# Patient Record
Sex: Male | Born: 1990 | Race: Black or African American | Hispanic: No | Marital: Single | Smoking: Never smoker
Health system: Southern US, Community
[De-identification: ages and names within clinical notes are randomized; demographics above are authoritative.]

## PROBLEM LIST (undated history)

## (undated) DIAGNOSIS — R55 Syncope and collapse: Secondary | ICD-10-CM

## (undated) DIAGNOSIS — N4830 Priapism, unspecified: Principal | ICD-10-CM

## (undated) DIAGNOSIS — R9431 Abnormal electrocardiogram [ECG] [EKG]: Secondary | ICD-10-CM

---

## 2011-06-05 ENCOUNTER — Emergency Department (HOSPITAL_COMMUNITY)
Admission: EM | Admit: 2011-06-05 | Discharge: 2011-06-05 | Disposition: A | Discharge status: Home / Self Care | Payer: PRIVATE HEALTH INSURANCE | Attending: Emergency Medicine | Admitting: Emergency Medicine

## 2011-06-05 DIAGNOSIS — Y92009 Unspecified place in unspecified non-institutional (private) residence as the place of occurrence of the external cause: Secondary | ICD-10-CM | POA: Insufficient documentation

## 2011-06-05 DIAGNOSIS — S61209A Unspecified open wound of unspecified finger without damage to nail, initial encounter: Secondary | ICD-10-CM | POA: Insufficient documentation

## 2011-06-05 DIAGNOSIS — W260XXA Contact with knife, initial encounter: Secondary | ICD-10-CM | POA: Insufficient documentation

## 2011-06-05 DIAGNOSIS — S6980XA Other specified injuries of unspecified wrist, hand and finger(s), initial encounter: Secondary | ICD-10-CM | POA: Insufficient documentation

## 2011-06-05 DIAGNOSIS — S6990XA Unspecified injury of unspecified wrist, hand and finger(s), initial encounter: Secondary | ICD-10-CM | POA: Insufficient documentation

## 2011-06-14 ENCOUNTER — Inpatient Hospital Stay (INDEPENDENT_AMBULATORY_CARE_PROVIDER_SITE_OTHER)
Admission: RE | Admit: 2011-06-14 | Discharge: 2011-06-14 | Disposition: A | Discharge status: Home / Self Care | Payer: PRIVATE HEALTH INSURANCE | Source: Ambulatory Visit | Attending: Emergency Medicine | Admitting: Emergency Medicine

## 2011-06-14 DIAGNOSIS — Z4802 Encounter for removal of sutures: Secondary | ICD-10-CM

## 2011-11-15 ENCOUNTER — Observation Stay (HOSPITAL_COMMUNITY)
Admission: EM | Admit: 2011-11-15 | Discharge: 2011-11-17 | Disposition: A | Discharge status: Home / Self Care | Payer: PRIVATE HEALTH INSURANCE | Source: Ambulatory Visit | Attending: Internal Medicine | Admitting: Internal Medicine

## 2011-11-15 ENCOUNTER — Other Ambulatory Visit: Payer: Self-pay

## 2011-11-15 ENCOUNTER — Encounter (HOSPITAL_COMMUNITY): Payer: Self-pay | Admitting: Neurology

## 2011-11-15 ENCOUNTER — Emergency Department (INDEPENDENT_AMBULATORY_CARE_PROVIDER_SITE_OTHER)
Admission: EM | Admit: 2011-11-15 | Discharge: 2011-11-15 | Disposition: A | Discharge status: Nursing Home (Includes ICF) | Payer: PRIVATE HEALTH INSURANCE | Source: Home / Self Care

## 2011-11-15 ENCOUNTER — Emergency Department (HOSPITAL_COMMUNITY): Payer: PRIVATE HEALTH INSURANCE

## 2011-11-15 DIAGNOSIS — N4830 Priapism, unspecified: Principal | ICD-10-CM

## 2011-11-15 DIAGNOSIS — R9431 Abnormal electrocardiogram [ECG] [EKG]: Secondary | ICD-10-CM

## 2011-11-15 DIAGNOSIS — I517 Cardiomegaly: Secondary | ICD-10-CM | POA: Insufficient documentation

## 2011-11-15 DIAGNOSIS — R55 Syncope and collapse: Secondary | ICD-10-CM | POA: Insufficient documentation

## 2011-11-15 HISTORY — DX: Abnormal electrocardiogram (ECG) (EKG): R94.31

## 2011-11-15 HISTORY — DX: Priapism, unspecified: N48.30

## 2011-11-15 HISTORY — DX: Syncope and collapse: R55

## 2011-11-15 LAB — POCT I-STAT, CHEM 8
Calcium, Ion: 1.17 mmol/L (ref 1.12–1.32)
Creatinine, Ser: 0.9 mg/dL (ref 0.50–1.35)
Glucose, Bld: 93 mg/dL (ref 70–99)
HCT: 47 % (ref 39.0–52.0)
Hemoglobin: 16 g/dL (ref 13.0–17.0)
Potassium: 4.1 mEq/L (ref 3.5–5.1)
TCO2: 27 mmol/L (ref 0–100)

## 2011-11-15 LAB — RAPID URINE DRUG SCREEN, HOSP PERFORMED
Amphetamines: NOT DETECTED
Barbiturates: NOT DETECTED
Opiates: NOT DETECTED
Tetrahydrocannabinol: NOT DETECTED

## 2011-11-15 MED ORDER — ACETAMINOPHEN 325 MG PO TABS: 650.0000 mg | ORAL_TABLET | Freq: Four times a day (QID) | ORAL | Status: DC | PRN

## 2011-11-15 MED ORDER — ONDANSETRON HCL 4 MG/2ML IJ SOLN: 4.0000 mg | Freq: Four times a day (QID) | INTRAMUSCULAR | Status: DC | PRN

## 2011-11-15 MED ORDER — ONDANSETRON HCL 4 MG PO TABS: 4.0000 mg | ORAL_TABLET | Freq: Four times a day (QID) | ORAL | Status: DC | PRN

## 2011-11-15 MED ORDER — SODIUM CHLORIDE 0.9 % IV SOLN: INTRAVENOUS | Status: DC

## 2011-11-15 MED ORDER — TERBUTALINE SULFATE 1 MG/ML IJ SOLN
0.2500 mg | Freq: Once | INTRAMUSCULAR | Status: AC
  Administered 2011-11-15: 0.25 mg via SUBCUTANEOUS
  Filled 2011-11-15: qty 1

## 2011-11-15 MED ORDER — PHENYLEPHRINE HCL 10 MG/ML IJ SOLN
0.1000 mg | Freq: Once | INTRAMUSCULAR | Status: AC | PRN
  Administered 2011-11-15: 0.1 mg via SUBCUTANEOUS
  Filled 2011-11-15: qty 0.01

## 2011-11-15 MED ORDER — ACETAMINOPHEN 650 MG RE SUPP: 650.0000 mg | Freq: Four times a day (QID) | RECTAL | Status: DC | PRN

## 2011-11-15 MED ORDER — PSEUDOEPHEDRINE HCL 60 MG PO TABS
60.0000 mg | ORAL_TABLET | ORAL | Status: AC
  Administered 2011-11-15: 60 mg via ORAL
  Filled 2011-11-15: qty 1

## 2011-11-15 MED ORDER — SODIUM CHLORIDE 0.9 % IV SOLN
INTRAVENOUS | Status: DC
  Administered 2011-11-16 – 2011-11-17 (×3): via INTRAVENOUS

## 2011-11-15 MED ORDER — SODIUM CHLORIDE 0.9 % IV SOLN
INTRAVENOUS | Status: DC
  Administered 2011-11-15 – 2011-11-17 (×4): via INTRAVENOUS

## 2011-11-15 NOTE — ED Notes (Signed)
Initially pt requesting to leave AMA. 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 EDP in to speak with patient about importance of staying for observation. Informed of severity of possible condition. Chaplain involved to speak with patient about possible severity of clinical condition. RN in to speak with patient. Informed of severity of EKG presentation. Pt encouraged to stay for observation. Pt requesting to call family member. After speaking with medical team and family member patient agreeing to stay for observation. Dinner tray ordered for patient. Attempt to make patient comfortable.

## 2011-11-15 NOTE — ED Provider Notes (Signed)
Medical screening examination/treatment/procedure(s) were performed by non-physician practitioner and as supervising physician I was immediately available for consultation/collaboration.  Raynald Blend, MD 11/15/11 1214

## 2011-11-15 NOTE — ED Notes (Signed)
1 day hx of urinary difficulty.  Denies any discharge.  In addition, pt. passed out in lobby before being seen.  Now pt. states his head hurts.  Denies any other pain sites.  Brought pt. Back to room to be seen after passing out.  Pt. States he started feeling dizzy and passed out.  States he hit his head when he fell.  Pt. Was able to answer orientation questions right away and was able to follow commands.  Moves all extremities well.

## 2011-11-15 NOTE — ED Notes (Signed)
Pt asked to provide urine sample. Reports unable to at this time. Urinal at bedside

## 2011-11-15 NOTE — ED Provider Notes (Signed)
History     CSN: [id number] Arrival date & time: 11/15/2011 11:16 AM   None     Chief Complaint  Patient presents with  . Urinary Retention    1 day hx of urinary difficulty.  Denies any discharge.  In addition, pt. passed out in lobby before being seen.  Now pt. states his head hurts.  Denies any other pain sites.      (Consider location/radiation/quality/duration/timing/severity/associated sxs/prior treatment) HPI Comments: "My penis won't go down."  States he has had an erection since he awoke at 6 am and won't go down. He denies taking any medications or using any over the counter supplements. Then he was registering at the front desk and began to feel lightheaded then blacked out. No chest discomfort or palpitations. Has been able to void a small amount this morning. Has a mildly tender area Rt posterior head from hitting his head when he passed out. Denies HA, or current dizziness. No N/V, visual changes or parasthesias.  The history is provided by the patient.    History reviewed. No pertinent past medical history.  History reviewed. No pertinent past surgical history.  History reviewed. No pertinent family history.  History  Substance Use Topics  . Smoking status: Never Smoker   . Smokeless tobacco: Never Used  . Alcohol Use: Yes     occational      Review of Systems  Gastrointestinal: Negative for nausea, vomiting and abdominal pain.  Genitourinary: Positive for decreased urine volume, penile swelling and penile pain. Negative for discharge, scrotal swelling, genital sores and testicular pain.  Neurological: Positive for syncope and light-headedness. Negative for weakness, numbness and headaches.    Allergies  Review of patient's allergies indicates no known allergies.  Home Medications  No current outpatient prescriptions on file.  BP 146/80  Pulse 63  Temp(Src) 98.5 F (36.9 C) (Oral)  Resp 16  SpO2 100%  Physical Exam  Nursing note and vitals  reviewed. Constitutional: He appears well-developed and well-nourished. No distress.  HENT:  Head: Normocephalic and atraumatic.  Right Ear: Tympanic membrane, external ear and ear canal normal.  Left Ear: Tympanic membrane, external ear and ear canal normal.  Nose: Nose normal.  Mouth/Throat: Uvula is midline, oropharynx is clear and moist and mucous membranes are normal. No oropharyngeal exudate, posterior oropharyngeal edema or posterior oropharyngeal erythema.  Eyes: Conjunctivae, EOM and lids are normal. Pupils are equal, round, and reactive to light.  Neck: Neck supple.  Cardiovascular: Normal rate, regular rhythm and normal heart sounds.   Pulmonary/Chest: Effort normal and breath sounds normal. No respiratory distress.  Genitourinary: Testes normal.    No discharge found.  Musculoskeletal:       Cervical back: Normal.  Lymphadenopathy:    He has no cervical adenopathy.       Right: No inguinal adenopathy present.       Left: No inguinal adenopathy present.  Neurological: He is alert.  Skin: Skin is warm and dry.  Psychiatric: He has a normal mood and affect.    ED Course  Procedures (including critical care time)  Labs Reviewed - No data to display No results found.   1. Priapism   2. Syncope       MDM  Pt transferred to [facility] via [facility] with Priapism x > 5 hrs and syncopal episode.         Melody Comas, [place] 11/15/11 1154

## 2011-11-15 NOTE — ED Provider Notes (Signed)
History     CSN: [id number] Arrival date & time: 11/15/2011 12:14 PM  Chief Complaint  Patient presents with  . Personal Problem    priprism   . Loss of Consciousness    syncope while at [facility]    HPI Pt was seen at 1250.  Per pt, c/o gradual onset and persistence of constant priapism that began this morning PTA.  Pt states he woke up approx 0700 with the priapism.  States he took a cold shower without change in condition.  Pt went to [facility] for eval, while he was there be became "lightheaded" followed by a brief syncopal episode.  Pt was then transferred to the ED for further eval.  Denies hx of sickle cell, denies illicit or herbal drug use, denies taking erectile dysfunction medicines.  No reported seizure activity, no CP/palpitations, no SOB, no abd pain, no back pain.    History reviewed. No pertinent past medical history.  History reviewed. No pertinent past surgical history.   History  Substance Use Topics  . Smoking status: Never Smoker   . Smokeless tobacco: Never Used  . Alcohol Use: Yes     occational     Review of Systems ROS: Statement: All systems negative except as marked or noted in the HPI; Constitutional: Negative for fever and chills. ; ; Eyes: Negative for eye pain, redness and discharge. ; ; ENMT: Negative for ear pain, hoarseness, nasal congestion, sinus pressure and sore throat. ; ; Cardiovascular: Negative for chest pain, palpitations, diaphoresis, dyspnea and peripheral edema. ; ; Respiratory: Negative for cough, wheezing and stridor. ; ; Gastrointestinal: Negative for nausea, vomiting, diarrhea, abdominal pain, blood in stool, hematemesis, jaundice and rectal bleeding. ; Genitourinary: Negative for dysuria, flank pain and hematuria. Genital:  No penile drainage or rash, no testicular pain or swelling, no scrotal rash or swelling.  +priapism.; Musculoskeletal: Negative for back pain and neck pain. Negative for swelling and trauma.; ; Skin: Negative for pruritus,  rash, abrasions, blisters, bruising and skin lesion.; ; Neuro: Negative for headache, and neck stiffness. Negative for weakness, altered level of consciousness , altered mental status, extremity weakness, paresthesias, involuntary movement, seizure and +lightheadedness, syncope.     Allergies  Review of patient's allergies indicates no known allergies.  Home Medications  No current outpatient prescriptions on file.  BP 148/80  Pulse 62  Temp(Src) 98 F (36.7 C) (Oral)  Resp 18  SpO2 100%  Physical Exam 1255: Physical examination:  Nursing notes reviewed; Vital signs and O2 SAT reviewed;  Constitutional: Well developed, Well nourished, Well hydrated, In no acute distress; Head:  Normocephalic, atraumatic; Eyes: EOMI, PERRL, No scleral icterus; ENMT: Mouth and pharynx normal, Mucous membranes moist; Neck: Supple, Full range of motion, No lymphadenopathy; Cardiovascular: Regular rate and rhythm, No murmur, rub, or gallop; Respiratory: Breath sounds clear & equal bilaterally, No rales, rhonchi, wheezes, or rub, Normal respiratory effort/excursion; Chest: Nontender, Movement normal; Abdomen: Soft, Nontender, Nondistended, Normal bowel sounds; GU:  Genital exam performed with pt permission and male ED Tech chaparone present during exam. No perineal erythema.  +priapism, no penile lesions or drainage.  No scrotal erythema, edema or tenderness to palp.  Normal testicular lie.  No testicular tenderness to palp.  +cremasteric reflexes bilat.  No inguinal LAN or palpable masses. Extremities: Pulses normal, No tenderness, No edema, No calf edema or asymmetry.; Neuro: AA&Ox3, Major CN grossly intact. Speech clear, no facial droop.  Normal coordination.  No gross focal motor or sensory deficits in extremities.;  Skin: Color normal, Warm, Dry, no rash.    ED Course  Inject corpus cavernosum, priapism Date/Time: 11/15/2011 3:30 PM Performed by: Laray Anger Authorized by: Laray Anger Consent:  Verbal consent obtained. Risks and benefits: risks, benefits and alternatives were discussed Consent given by: patient Patient understanding: patient states understanding of the procedure being performed Patient consent: the patient's understanding of the procedure matches consent given Procedure consent: procedure consent matches procedure scheduled Relevant documents: relevant documents present and verified Test results available and properly labeled: N/A. Site marked: the operative site was marked Imaging studies available: N/A. Required items: required blood products, implants, devices, and special equipment available Patient identity confirmed: verbally with patient, arm band, hospital-assigned identification number and provided demographic data Time out: Immediately prior to procedure a "time out" was called to verify the correct patient, procedure, equipment, support staff and site/side marked as required. Preparation: Patient was prepped and draped in the usual sterile fashion. Local anesthesia used: no Patient sedated: no Patient tolerance: Patient tolerated the procedure well with no immediate complications.      MDM  MDM Reviewed: nursing note, vitals and previous chart Interpretation: ECG, labs, x-ray and CT scan Consults: cardiology    Date: 11/15/2011  Rate: 57  Rhythm: normal sinus rhythm  QRS Axis: normal  Intervals: normal  ST/T Wave abnormalities: nonspecific T wave changes, RSR V1/V2, +biphasic T-waves leads V2-V4  Conduction Disutrbances:none  Narrative Interpretation:   Old EKG Reviewed: none available.  Results for orders placed during the hospital encounter of 11/15/11  POCT I-STAT, CHEM 8      Component Value Range   Sodium 140  135 - 145 (mEq/L)   Potassium 4.1  3.5 - 5.1 (mEq/L)   Chloride 104  96 - 112 (mEq/L)   BUN 5 (*) 6 - 23 (mg/dL)   Creatinine, Ser 1.61  0.50 - 1.35 (mg/dL)   Glucose, Bld 93  70 - 99 (mg/dL)   Calcium, Ion 0.96  0.45 -  1.32 (mmol/L)   TCO2 27  0 - 100 (mmol/L)   Hemoglobin 16.0  13.0 - 17.0 (g/dL)   HCT 40.9  81.1 - 91.4 (%)  POCT I-STAT TROPONIN I      Component Value Range   Troponin i, poc 0.01  0.00 - 0.08 (ng/mL)   Comment 3            Ct Head Wo Contrast 11/15/2011  *RADIOLOGY REPORT*  Clinical Data: Loss of consciousness.  Hit back of head.  Denies headache.  CT HEAD WITHOUT CONTRAST  Technique:  Contiguous axial images were obtained from the base of the skull through the vertex without contrast.  Comparison: None.  Findings: No acute cortical infarct, hemorrhage, mass lesion is present.  The ventricles are of normal size.  No significant extra- axial fluid collection is present.  Right parietal and occipital scalp soft tissue swelling is present. There is no underlying fracture.  A fluid level in the left maxillary sinus is of intermediate density.  Mucosal thickening is present in the right maxillary sinus.  There is opacification along the inferior aspect of the right frontal sinus.  The mastoid air cells are clear.  IMPRESSION:  1.  Normal CT appearance the brain. 2.  Right occipital parietal scalp soft tissue swelling without underlying fracture. 3.  Left greater than right maxillary sinus disease.  Original Report Authenticated By: Jamesetta Orleans. MATTERN, M.D.   Dg Chest Port 1 View 11/15/2011  *RADIOLOGY REPORT*  Clinical Data: Syncopal  episode.  No chest complaints  PORTABLE CHEST - 1 VIEW  Comparison: None.  Findings: Normal heart size with clear lung fields.  No bony abnormality.  IMPRESSION: Negative.  Original Report Authenticated By: Elsie Stain, M.D.     1330:  T/C to [facility] Dr. Annabell Howells, case discussed, including:  HPI, pertinent PM/SHx, VS/PE, dx testing, ED course and treatment.  Requests to try phenylephrine intracavernosal injection, if not successful call him back.   1500:  Cards MD Stucky in ED, he reviewed pt's EKG with myself.  Agrees with my concern of biphasic T-waves leads V2-4 (may  be indicative of an LAD lesion) and needs obs admit; ok for medicine svc to admit to tele.  Long d/w pt re: abnl EKG, and my recommendation for admit/observation.  Pt stated he would "think about it."  1530:  Sudafed PO and terbutaline SQ given, priapism unchanged.  Procedure (as above):  intracavernosal injection of 2ml of 147mcg/ml phenylepherine soln injected at 2 o'clock area at base of penis after prepped in usual sterile fashion and verbal consent obtained by pt to perform procedure. Tolerated well without adverse reaction.  1645:  Complete and sustained detumescence.  Pt wants to go home.  Once again: myself, ED RN and Hospital Chaplain all spoke with pt re: abnl EKG that may be a sign that he is high risk for heart disease/MI with a potential for substantial mortality (death).  Pt verb understanding and states again he wants to go home.  Pt again informed re: dx testing results, including EKG changes concerning for cardiac disease and that I recommend admission for further evaluation.  Pt again refuses admission.  I encouraged pt to stay, continues to refuse.  Pt makes his own medical decisions.  Risks of AMA explained to pt by myself, the ED RN and the hospital Chaplain, including, but not limited to:  stroke, heart attack, cardiac arrythmia ("irregular heart rate/beat"), "passing out," temporary and/or permanent disability, and very high risk of death.  Pt verb understanding and continues to refuse admission, understanding the consequences of his decision.  I encouraged pt to follow up with his PMD and the Cardiologist tomorrow and return to the ED immediately if symptoms return, or for any other concerns.  Pt verb understanding, agreeable.      1725:  Pt's ED RN spoke with pt once again.  Pt called his grandfather.  Pt put me on the phone with his grandfather after giving me permission to talk with him. Long d/w pt's grandfather regarding my concerns of pt's abnl EKG.  Grandfather states to admit pt  to the hospital and he will further talk with pt.  Pt now agreeable to admit.  5:42 PM:  T/C to [facility] Dr. 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  Agreeable to admit.  She will come to ED for eval.       [facility]   Laray Anger, DO 11/17/11 1101

## 2011-11-15 NOTE — ED Notes (Signed)
Pt taken to radiology

## 2011-11-15 NOTE — Progress Notes (Signed)
Dr. Asked that I accompany her as a male observer while she physically examined pt.( male). Pt. refused dr.'s advise and indicated that he was leaving AMA.  I spoke to him to ensure that this was what he really wanted to do. After speaking with both of [place]  pt. indicated that he was still going to leave. Dr. expressed her concerns regarding his welfare.    11/15/11 1600  Clinical Encounter Type  Visited With Patient  Visit Type Other (Comment) (Assisted  Physican with Pt.)  Spiritual Encounters  Spiritual Needs (provide counsel and encouragement to pt. Marland Kitchen)

## 2011-11-15 NOTE — ED Notes (Addendum)
Pt genital pain 8/10. Reports urinating a small amount this morning. Denying any CP or SOB. Vitals stable. Pt on monitor

## 2011-11-15 NOTE — ED Notes (Signed)
Pt order dinner tray, given snacks, water per Dr. Clarene Duke

## 2011-11-15 NOTE — Progress Notes (Signed)
11/15/2011 patient came from the emergency room to 6700, he's alert and oriented. Patient a little weak. He was placed on telemetry and doing sinus rhythm. Patient had no complaints of any pain or discomfort. Skin is fine, noted little dark area on the back and two little pimples on the right side of neck. Patient is a fall risk have a yellow arm band and red socks on.

## 2011-11-15 NOTE — ED Notes (Signed)
Dr. Lynford Citizen paged for temporary orders. MD reporting send pt upstairs will see upstairs

## 2011-11-15 NOTE — ED Notes (Signed)
EDP at bedside  

## 2011-11-15 NOTE — ED Notes (Signed)
PER EMS- Pt reports woke up this morning at 7 am with Priprism. Denies taking any viagra or other medications. Pt reports attempting to take cold shower to relieve problem, all attempts were unsuccessful. While at Urgent care, pt had syncopal episode. IV started while at [facility]. At this time patient alert and oriented. Pain 8/10. Pt responding appropriately.

## 2011-11-15 NOTE — ED Notes (Signed)
FLOOR RN informed pt will be seen by admitting MD on the floor;

## 2011-11-15 NOTE — H&P (Addendum)
Joe Williamson is an 20 y.o. male.   PCP - None. Chief Complaint: Loss of consciousness. HPI: 20 year old male with no significant history had a persistent erection of his penis and he came to the ER. While waiting in the ER patient had a brief episode of loss of consciousness. He does not recall how long he lost his consciousness but he did hit his head. He denies any focal deficit, any headache, palpitations, shortness of breath, diaphoresis, nausea vomiting, chest pain. He did have a brief episode blurred vision before he fell. Patient had CAT scan of the head which did not show any acute. His EKG was showing Wellen's sign as per the ER physician. Patient's cardiac enzymes have been negative patient has been admitted for further observation. For his priapism ER physician Dr. Clarene Duke had contacted urologist on call Dr. Wilson Singer who advised to inject phenylephrine. After which his penis detumescent. He states that he had sexual contact late in the night and since then his erection was constant. Denies any trauma and denies any family she of sickle cell disease or personal history of sickle cell disease. Denies abusing any illegal drugs.  History reviewed. No pertinent past medical history.  History reviewed. No pertinent past surgical history.  Family History  Problem Relation Age of Onset  . Diabetes type II Mother    Social History:  reports that he has never smoked. He has never used smokeless tobacco. He reports that he drinks alcohol. He reports that he does not use illicit drugs.  Allergies: No Known Allergies  Medications Prior to Admission  Medication Dose Route Frequency Provider Last Rate Last Dose  . 0.9 %  sodium chloride infusion   Intravenous Continuous Laray Anger, DO 125 mL/hr at 11/15/11 1354    . 0.9 %  sodium chloride infusion   Intravenous Continuous Eduard Clos      . acetaminophen (TYLENOL) tablet 650 mg  650 mg Oral Q6H PRN Eduard Clos       Or  . acetaminophen (TYLENOL) suppository 650 mg  650 mg Rectal Q6H PRN Eduard Clos      . ondansetron (ZOFRAN) tablet 4 mg  4 mg Oral Q6H PRN Eduard Clos       Or  . ondansetron (ZOFRAN) injection 4 mg  4 mg Intravenous Q6H PRN Eduard Clos      . phenylephrine (NEO-SYNEPHRINE) injection 0.1 mg  0.1 mg Subcutaneous Once PRN Laray Anger, DO   0.1 mg at 11/15/11 1548  . pseudoephedrine (SUDAFED) tablet 60 mg  60 mg Oral To Major Laray Anger, DO   60 mg at 11/15/11 1412  . terbutaline (BRETHINE) injection 0.25 mg  0.25 mg Subcutaneous Once Laray Anger, DO   0.25 mg at 11/15/11 1446  . DISCONTD: 0.9 %  sodium chloride infusion   Intravenous Continuous Melody Comas, [place]       No current outpatient prescriptions on file as of 11/15/2011.    Results for orders placed during the hospital encounter of 11/15/11 (from the past 48 hour(s))  POCT I-STAT TROPONIN I     Status: Normal   Collection Time   11/15/11  1:21 PM      Component Value Range Comment   Troponin i, poc 0.01  0.00 - 0.08 (ng/mL)    Comment 3            POCT I-STAT, CHEM 8     Status: Abnormal  Collection Time   11/15/11  1:23 PM      Component Value Range Comment   Sodium 140  135 - 145 (mEq/L)    Potassium 4.1  3.5 - 5.1 (mEq/L)    Chloride 104  96 - 112 (mEq/L)    BUN 5 (*) 6 - 23 (mg/dL)    Creatinine, Ser 1.61  0.50 - 1.35 (mg/dL)    Glucose, Bld 93  70 - 99 (mg/dL)    Calcium, Ion 0.96  1.12 - 1.32 (mmol/L)    TCO2 27  0 - 100 (mmol/L)    Hemoglobin 16.0  13.0 - 17.0 (g/dL)    HCT 04.5  40.9 - 81.1 (%)   URINE RAPID DRUG SCREEN (HOSP PERFORMED)     Status: Normal   Collection Time   11/15/11  6:01 PM      Component Value Range Comment   Opiates NONE DETECTED  NONE DETECTED     Cocaine NONE DETECTED  NONE DETECTED     Benzodiazepines NONE DETECTED  NONE DETECTED     Amphetamines NONE DETECTED  NONE DETECTED     Tetrahydrocannabinol NONE DETECTED  NONE DETECTED      Barbiturates NONE DETECTED  NONE DETECTED     Ct Head Wo Contrast  11/15/2011  *RADIOLOGY REPORT*  Clinical Data: Loss of consciousness.  Hit back of head.  Denies headache.  CT HEAD WITHOUT CONTRAST  Technique:  Contiguous axial images were obtained from the base of the skull through the vertex without contrast.  Comparison: None.  Findings: No acute cortical infarct, hemorrhage, mass lesion is present.  The ventricles are of normal size.  No significant extra- axial fluid collection is present.  Right parietal and occipital scalp soft tissue swelling is present. There is no underlying fracture.  A fluid level in the left maxillary sinus is of intermediate density.  Mucosal thickening is present in the right maxillary sinus.  There is opacification along the inferior aspect of the right frontal sinus.  The mastoid air cells are clear.  IMPRESSION:  1.  Normal CT appearance the brain. 2.  Right occipital parietal scalp soft tissue swelling without underlying fracture. 3.  Left greater than right maxillary sinus disease.  Original Report Authenticated By: Joe Williamson, M.D.   Dg Chest Port 1 View  11/15/2011  *RADIOLOGY REPORT*  Clinical Data: Syncopal episode.  No chest complaints  PORTABLE CHEST - 1 VIEW  Comparison: None.  Findings: Normal heart size with clear lung fields.  No bony abnormality.  IMPRESSION: Negative.  Original Report Authenticated By: Elsie Stain, M.D.    Review of Systems  Constitutional: Negative.   HENT: Negative.   Eyes: Negative.   Respiratory: Negative.   Cardiovascular: Negative.   Gastrointestinal: Negative.   Genitourinary:       Priapism  Musculoskeletal: Negative.   Skin: Negative.   Neurological: Positive for loss of consciousness.  Endo/Heme/Allergies: Negative.   Psychiatric/Behavioral: Negative.     Blood pressure 125/83, pulse 61, temperature 98.4 F (36.9 C), temperature source Oral, resp. rate 18, height 5\' 7"  (1.702 m), weight 75.8 kg  (167 lb 1.7 oz), SpO2 96.00%. Physical Exam  Constitutional: He is oriented to person, place, and time. He appears well-developed and well-nourished. No distress.  HENT:  Head: Normocephalic and atraumatic.  Right Ear: External ear normal.  Left Ear: External ear normal.  Nose: Nose normal.  Mouth/Throat: Oropharynx is clear and moist. No oropharyngeal exudate.  Eyes: Conjunctivae are normal. Pupils  are equal, round, and reactive to light. Right eye exhibits no discharge. Left eye exhibits no discharge.  Neck: Normal range of motion. Neck supple. No JVD present.  Cardiovascular: Normal rate, regular rhythm and normal heart sounds.   Respiratory: Effort normal and breath sounds normal. No stridor. No respiratory distress. He has no wheezes. He has no rales.  GI: Soft. Bowel sounds are normal. He exhibits no distension. There is no tenderness.  Genitourinary:       Penis has detumescence. No discharge seen or not tender.  Musculoskeletal: Normal range of motion. He exhibits no edema and no tenderness.  Neurological: He is alert and oriented to person, place, and time. He has normal reflexes. No cranial nerve deficit. Coordination normal.  Skin: Skin is warm and dry. No rash noted. He is not diaphoretic.  Psychiatric: His behavior is normal.     Assessment/Plan #1. Syncope with abnormal EKG - will monitor in the telemetry. His EKG was showing Wellen's sign as per ER physician and I don't have a copy of the EKG so I have ordered another one. Get a  2-D echo. Presently patient has no chest pain, shortness of breath, focal deficit, palpitation, any headache. #2. Priapism - it has resolved after the phenylephrine injection. I did discuss with on-call urologist Dr Wilson Singer who advised as patient is asymptomatic with regard to his priapism advised to get a sickle cell screening and to arrange followup with him as outpatient.  CODE STATUS - full code.   Joe Berrocal N. 11/15/2011, 9:39  PM   Addendum - I reviewed patient's EKG, it does show some ST-T changes in V1 V2. Will consult cardiology.

## 2011-11-16 LAB — TSH: TSH: 2.336 u[IU]/mL (ref 0.350–4.500)

## 2011-11-16 LAB — CBC
HCT: 40.5 % (ref 39.0–52.0)
MCHC: 32.3 g/dL (ref 30.0–36.0)
MCV: 82.7 fL (ref 78.0–100.0)
Platelets: 194 10*3/uL (ref 150–400)
RDW: 14.3 % (ref 11.5–15.5)
WBC: 10.2 10*3/uL (ref 4.0–10.5)

## 2011-11-16 LAB — COMPREHENSIVE METABOLIC PANEL
ALT: 12 U/L (ref 0–53)
Albumin: 3.6 g/dL (ref 3.5–5.2)
Alkaline Phosphatase: 56 U/L (ref 39–117)
BUN: 7 mg/dL (ref 6–23)
Chloride: 105 mEq/L (ref 96–112)
GFR calc Af Amer: >90 mL/min (ref >90)
Glucose, Bld: 88 mg/dL (ref 70–99)
Potassium: 4.1 mEq/L (ref 3.5–5.1)
Sodium: 139 mEq/L (ref 135–145)
Total Bilirubin: 0.9 mg/dL (ref 0.3–1.2)
Total Protein: 7.1 g/dL (ref 6.0–8.3)

## 2011-11-16 LAB — CARDIAC PANEL(CRET KIN+CKTOT+MB+TROPI)
CK, MB: 2.3 ng/mL (ref 0.3–4.0)
CK, MB: 2.1 ng/mL (ref 0.3–4.0)
CK, MB: 1.9 ng/mL (ref 0.3–4.0)
Troponin I: <0.3 ng/mL (ref <0.30)
Troponin I: <0.3 ng/mL (ref <0.30)

## 2011-11-16 LAB — MAGNESIUM: Magnesium: 1.9 mg/dL (ref 1.5–2.5)

## 2011-11-16 NOTE — Progress Notes (Signed)
rn notified 

## 2011-11-16 NOTE — Progress Notes (Signed)
*  PRELIMINARY RESULTS* Echocardiogram 2D Echocardiogram has been performed.  Clide Deutscher RDCS 11/16/2011, 3:47 PM

## 2011-11-16 NOTE — Progress Notes (Signed)
Patient ID: Joe Williamson, male   DOB: January 21, 1991, 20 y.o.   MRN: [id number] Subjective: Patient seen.No penile erection.Denies any complaints  Objective: Weight change:   Intake/Output Summary (Last 24 hours) at 11/16/11 0843 Last data filed at 11/16/11 5621  Gross per 24 hour  Intake 2513.33 ml  Output    500 ml  Net 2013.33 ml   BP 131/74  Pulse 67  Temp(Src) 98.1 F (36.7 C) (Oral)  Resp 18  Ht 5\' 7"  (1.702 m)  Wt 75.8 kg (167 lb 1.7 oz)  BMI 26.17 kg/m2  SpO2 98% Physical Exam: General appearance: alert, cooperative and no distress Head: Normocephalic, without obvious abnormality, atraumatic Neck: no adenopathy, no carotid bruit, no JVD, supple, symmetrical, trachea midline and thyroid not enlarged, symmetric, no tenderness/mass/nodules Lungs: clear to auscultation bilaterally Heart: regular rate and rhythm, S1, S2 normal, no murmur, click, rub or gallop Abdomen: soft, non-tender; bowel sounds normal; no masses,  no organomegaly Extremities: extremities normal, atraumatic, no cyanosis or edema Skin: Skin color, texture, turgor normal. No rashes or lesions  Lab Results: Results for orders placed during the hospital encounter of 11/15/11 (from the past 48 hour(s))  POCT I-STAT TROPONIN I     Status: Normal   Collection Time   11/15/11  1:21 PM      Component Value Range Comment   Troponin i, poc 0.01  0.00 - 0.08 (ng/mL)    Comment 3            POCT I-STAT, CHEM 8     Status: Abnormal   Collection Time   11/15/11  1:23 PM      Component Value Range Comment   Sodium 140  135 - 145 (mEq/L)    Potassium 4.1  3.5 - 5.1 (mEq/L)    Chloride 104  96 - 112 (mEq/L)    BUN 5 (*) 6 - 23 (mg/dL)    Creatinine, Ser 3.08  0.50 - 1.35 (mg/dL)    Glucose, Bld 93  70 - 99 (mg/dL)    Calcium, Ion 6.57  1.12 - 1.32 (mmol/L)    TCO2 27  0 - 100 (mmol/L)    Hemoglobin 16.0  13.0 - 17.0 (g/dL)    HCT 84.6  96.2 - 95.2 (%)   URINE RAPID DRUG SCREEN (HOSP PERFORMED)      Status: Normal   Collection Time   11/15/11  6:01 PM      Component Value Range Comment   Opiates NONE DETECTED  NONE DETECTED     Cocaine NONE DETECTED  NONE DETECTED     Benzodiazepines NONE DETECTED  NONE DETECTED     Amphetamines NONE DETECTED  NONE DETECTED     Tetrahydrocannabinol NONE DETECTED  NONE DETECTED     Barbiturates NONE DETECTED  NONE DETECTED    CARDIAC PANEL(CRET KIN+CKTOT+MB+TROPI)     Status: Abnormal   Collection Time   11/15/11 10:05 PM      Component Value Range Comment   Total CK 240 (*) 7 - 232 (U/L)    CK, MB 2.3  0.3 - 4.0 (ng/mL)    Troponin I <0.30  <0.30 (ng/mL)    Relative Index 1.0  0.0 - 2.5    CARDIAC PANEL(CRET KIN+CKTOT+MB+TROPI)     Status: Normal   Collection Time   11/16/11  6:30 AM      Component Value Range Comment   Total CK 170  7 - 232 (U/L)    CK, MB 2.1  0.3 -  4.0 (ng/mL)    Troponin I <0.30  <0.30 (ng/mL)    Relative Index 1.2  0.0 - 2.5    CBC     Status: Normal   Collection Time   11/16/11  6:30 AM      Component Value Range Comment   WBC 10.2  4.0 - 10.5 (K/uL)    RBC 4.90  4.22 - 5.81 (MIL/uL)    Hemoglobin 13.1  13.0 - 17.0 (g/dL) DELTA CHECK NOTED   HCT 40.5  39.0 - 52.0 (%)    MCV 82.7  78.0 - 100.0 (fL)    MCH 26.7  26.0 - 34.0 (pg)    MCHC 32.3  30.0 - 36.0 (g/dL)    RDW 95.2  84.1 - 32.4 (%)    Platelets 194  150 - 400 (K/uL)     Micro Results: No results found for this or any previous visit (from the past 240 hour(s)).  Studies/Results: Ct Head Wo Contrast  11/15/2011  *RADIOLOGY REPORT*  Clinical Data: Loss of consciousness.  Hit back of head.  Denies headache.  CT HEAD WITHOUT CONTRAST  Technique:  Contiguous axial images were obtained from the base of the skull through the vertex without contrast.  Comparison: None.  Findings: No acute cortical infarct, hemorrhage, mass lesion is present.  The ventricles are of normal size.  No significant extra- axial fluid collection is present.  Right parietal and  occipital scalp soft tissue swelling is present. There is no underlying fracture.  A fluid level in the left maxillary sinus is of intermediate density.  Mucosal thickening is present in the right maxillary sinus.  There is opacification along the inferior aspect of the right frontal sinus.  The mastoid air cells are clear.  IMPRESSION:  1.  Normal CT appearance the brain. 2.  Right occipital parietal scalp soft tissue swelling without underlying fracture. 3.  Left greater than right maxillary sinus disease.  Original Report Authenticated By: Jamesetta Orleans. MATTERN, M.D.   Dg Chest Port 1 View  11/15/2011  *RADIOLOGY REPORT*  Clinical Data: Syncopal episode.  No chest complaints  PORTABLE CHEST - 1 VIEW  Comparison: None.  Findings: Normal heart size with clear lung fields.  No bony abnormality.  IMPRESSION: Negative.  Original Report Authenticated By: Elsie Stain, M.D.   Medications: Scheduled Meds:   . pseudoephedrine  60 mg Oral To Major  . terbutaline  0.25 mg Subcutaneous Once   Continuous Infusions:   . sodium chloride 125 mL/hr at 11/16/11 0639  . sodium chloride     PRN Meds:.acetaminophen, acetaminophen, ondansetron (ZOFRAN) IV, ondansetron, phenylephrine  Assessment/Plan: #1 Syncope-?micturitional-resolved #2 Priapism-resolved after phenylepherine injection.Patient is to follow up with the urologist #3 Abnormal EKG-scheduled for 2d echo today. If clinicaly stable by a.m.d/c home   LOS: 1 day   Viha Kriegel 11/16/2011, 8:43 AM

## 2011-11-17 ENCOUNTER — Encounter (HOSPITAL_COMMUNITY): Payer: Self-pay | Admitting: Nurse Practitioner

## 2011-11-17 ENCOUNTER — Other Ambulatory Visit: Payer: Self-pay

## 2011-11-17 DIAGNOSIS — R55 Syncope and collapse: Secondary | ICD-10-CM

## 2011-11-17 LAB — HEMOGLOBINOPATHY EVALUATION: Hgb A2 Quant: 2.3 % (ref 2.2–3.2)

## 2011-11-17 NOTE — Discharge Summary (Addendum)
Patient ID: Joe Williamson MRN: [id number] DOB/AGE: 22/10/1991 22 y.o.  Admit date: 11/15/2011 Discharge date: 11/17/2011  Primary Care Physician:  No primary provider on file.  Discharge Diagnoses:    1. syncope likely vasovagal 2. Priapism 3. Abnormal EKG  4. LV hyertrophy as per 2D echo   There are no discharge medications for this patient.   Disposition and Follow-up:   1. [facility] On 11/19/2011 Exercise Treadmill Test at  3pm  [address] [place] [address] [phone]   2. WEAVER, [place] T On 12/07/2011 10:00 am [address] [place] [address] [phone]    Consults:   [facility] cardiology)   Significant Diagnostic Studies:  Ct Head Wo Contrast  11/15/2011  *RADIOLOGY REPORT*  Clinical Data: Loss of consciousness.  Hit back of head.  Denies headache.  CT HEAD WITHOUT CONTRAST  Technique:  Contiguous axial images were obtained from the base of the skull through the vertex without contrast.  Comparison: None.  Findings: No acute cortical infarct, hemorrhage, mass lesion is present.  The ventricles are of normal size.  No significant extra- axial fluid collection is present.  Right parietal and occipital scalp soft tissue swelling is present. There is no underlying fracture.  A fluid level in the left maxillary sinus is of intermediate density.  Mucosal thickening is present in the right maxillary sinus.  There is opacification along the inferior aspect of the right frontal sinus.  The mastoid air cells are clear.  IMPRESSION:  1.  Normal CT appearance the brain. 2.  Right occipital parietal scalp soft tissue swelling without underlying fracture. 3.  Left greater than right maxillary sinus disease.  Original Report Authenticated By: Jamesetta Orleans. MATTERN, M.D.   Dg Chest Port 1 View  11/15/2011  *RADIOLOGY REPORT*  Clinical Data: Syncopal episode.  No chest complaints   PORTABLE CHEST - 1 VIEW  Comparison: None.  Findings: Normal heart size with clear lung fields.  No bony abnormality.  IMPRESSION: Negative.  Original Report Authenticated By: Elsie Stain, M.D.    2D Echo 11/16/2011  Study Conclusions  - Left ventricle: The cavity size was normal. Wall thickness was increased in a pattern of mild LVH. Systolic function was normal. The estimated ejection fraction was in the range of 60% to 65%. There is an LV false tendon at the apex (normal variant). Wall motion was normal; there were no regional wall motion abnormalities. Left ventricular diastolic function parameters were normal. - Mitral valve: Mildly thickened leaflets . Trivial regurgitation. - Left atrium: The atrium was normal in size. - Systemic veins: The IVC measures >2.1 cm but collapses >50%, suggesting an elevated RA pressure of 10 mmHg.   Brief H and P: For complete details please refer to admission H and P, but in brief 20 year old male with no significant history had a persistent erection of his penis and he came to the ER. While waiting in the ER patient had a brief episode of loss of consciousness. He does not recall how long he lost his consciousness but he did hit his head. He denies any focal deficit, any headache, palpitations, shortness of breath, diaphoresis, nausea vomiting, chest pain. He did have a brief episode blurred vision before he fell. Patient had CAT scan of the head which did not show any acute. His EKG was showing Wellen's sign as per the ER physician. Patient's cardiac enzymes have been negative patient has been admitted for further  observation. For his priapism ER physician Dr. Clarene Duke had contacted urologist on call Dr. Wilson Singer who advised to inject phenylephrine. After which his penis detumescent. He states that he had sexual contact late in the night and since then his erection was constant. Denies any trauma and denies any family she of sickle cell disease or personal  history of sickle cell disease. Denies abusing any illegal drugs.   Physical Exam on Discharge:  Filed Vitals:   11/16/11 2100 11/17/11 0533 11/17/11 1000 11/17/11 1400  BP: 145/84 125/84 127/85 124/82  Pulse: 73 77 69 71  Temp: 100.3 F (37.9 C) 98.8 F (37.1 C) 98.9 F (37.2 C) 98.9 F (37.2 C)  TempSrc: Oral Oral Oral Oral  Resp: 18 16 20 18   Height:      Weight: 76.9 kg (169 lb 8.5 oz)     SpO2: 98% 99% 97% 97%     Intake/Output Summary (Last 24 hours) at 11/17/11 1616 Last data filed at 11/17/11 1549  Gross per 24 hour  Intake 5711.66 ml  Output      0 ml  Net 5711.66 ml    General: Alert, awake, oriented x3, in no acute distress. HEENT: No bruits, no goiter. Heart: Regular rate and rhythm, without murmurs, rubs, gallops. Lungs: Clear to auscultation bilaterally. Abdomen: Soft, nontender, nondistended, positive bowel sounds. No penile swelling Extremities: No clubbing cyanosis or edema with positive pedal pulses. Neuro: Grossly intact, nonfocal.  CBC:    Component Value Date/Time   WBC 10.2 11/16/2011 0630   HGB 13.1 11/16/2011 0630   HCT 40.5 11/16/2011 0630   PLT 194 11/16/2011 0630   MCV 82.7 11/16/2011 0630    Basic Metabolic Panel:    Component Value Date/Time   NA 139 11/16/2011 0630   K 4.1 11/16/2011 0630   CL 105 11/16/2011 0630   CO2 25 11/16/2011 0630   BUN 7 11/16/2011 0630   CREATININE 0.85 11/16/2011 0630   GLUCOSE 88 11/16/2011 0630   CALCIUM 9.4 11/16/2011 0630    Hospital Course:  -Patient was given IV phenylephrine for his priapism in the ED after consulting urology after which his penis detumescent.  -Patient admitted to medical floor on telmetry. Head CT done on admission for syncope unremarkable for his syncopal ecvent and likelty secondarty to vasovagal episode.  Patient noted to have EKG changes on admission with lateral TWI in v1-v4. Serial CE negative. patient noted on tele to have episodes of HR in 40s. 2d echo done showed  mild pattern of LVH but normal EF and no wall motion abnormalities.  Cardiology consult called who recommended this to be possible benign repolarization variant on ekg without symptoms, family hx of arrythmia , negative troponins and normal 2D echo. Plan on exercise stress test as outpt for 12/21.  Marland Kitchen Patient clinically stable for discharge home with outpt follow up with cardiology.    Time spent on Discharge: 45 minutes  Signed: Eddie North 11/17/2011, 4:16 PM

## 2011-11-17 NOTE — Progress Notes (Signed)
   CARE MANAGEMENT NOTE 11/17/2011  Patient:  Joe Williamson, Joe Williamson   Account Number:  [id number]  Date Initiated:  11/17/2011  Documentation initiated by:  Onnie Boer  Subjective/Objective Assessment:   PT WAS ADMITTED WITH SYNCOPE AND PRIAPISM     Action/Plan:   PROGRESSION OF CARE AND DISCHARGE PLANNING   Anticipated DC Date:  11/18/2011   Anticipated DC Plan:  HOME/SELF CARE      DC Planning Services  CM consult      Choice offered to / List presented to:             Status of service:  In process, will continue to follow Medicare Important Message given?   (If response is "NO", the following Medicare IM given date fields will be blank) Date Medicare IM given:   Date Additional Medicare IM given:    Discharge Disposition:    Per UR Regulation:  Reviewed for med. necessity/level of care/duration of stay  Comments:  11/16/2011 Cieanna Stormes, RN,BSN 1629 PT WAS ADMITTED WITH SYNCOPE AND WAS HAVING EKG CHANGES AND NEEDED A CARD CONSULT, WHEN MEDICALLY STABLE PT WILL DC TO HOME.

## 2011-11-17 NOTE — Consult Note (Signed)
CARDIOLOGY CONSULT NOTE  Patient ID: Joe Williamson MRN: [id number], DOB/AGE: Apr 23, 1991   Admit date: 11/15/2011 Date of Consult: 11/17/2011   Primary Physician: No primary provider on file. Primary Cardiologist: New - Seen by D. Bensimhon  Pt. Profile:   20 year old male w/o prior cardiac history who was admitted for priapism and a syncopal spell.  Problem List: Past Medical History  Diagnosis Date  . 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 Syncope     11/16/2011 in setting of priapism  . Abnormal EKG     TWI V1-V4    History reviewed. No pertinent past surgical history.   Allergies: No Known Allergies  HPI:   20 y/o male w/o prior cardiac history.  Pt was in his usoh until Monday 12/17.  Pt had sexual intercourse the night before and afterwards noted a persistent erection, which persisted into the morning of 12/17.  He denies having taken any prescription or otc/herbal erectile dysfunction meds.  He presented to a local urgent care, where upon being summoned to the front desk, he stood, walked about 15 feet, felt LH,then noted whiting-out of vision followed by syncope.  He does not remember falling and does not know how long he was without consciousness.  He fell backward and struck his head on the floor.  His next memory was of UC staff standing over him.  He was then taken to the [facility] ED, where his head CT was NL but EKG showed anterior TWI.  Priapism was treated with intracavernosal inj of phenylepherine , with resolution of erection.  B/C of T-wave changes and syncope, decision was made to admit pt for further eval.    Since admission, pt has been stable w/o recurrent erection or syncope.  His cardiac markers have been normal and his echo shows NL LV function (w/ mild LVH).  He has no prior h/o syncope, chest pain, or doe with usual activities.  He is quite active in sports without significant limitations.  Inpatient Medications:   0.9 NS @ 123ml/hr  Family History    Problem Relation Age of Onset  . Diabetes type II Mother      History   Social History  . Marital Status: Single    Spouse Name: N/A    Number of Children: N/A  . Years of Education: N/A   Occupational History  . Not on file.   Social History Main Topics  . Smoking status: Never Smoker   . Smokeless tobacco: Never Used  . Alcohol Use: Yes     occational  . Drug Use: No  . Sexually Active: No   Other Topics Concern  . Not on file   Social History Narrative  . No narrative on file     Review of Systems: General: negative for chills, fever, night sweats or weight changes.  Cardiovascular:+++presyncope and syncope as outlined in the HPI.  Negative for chest pain, dyspnea on exertion, edema, orthopnea, palpitations, paroxysmal nocturnal dyspnea. Dermatological: negative for rash Respiratory: negative for cough or wheezing Urologic: negative for hematuria.  Priapism as noted above. Abdominal: negative for nausea, vomiting, diarrhea, bright red blood per rectum, melena, or hematemesis Neurologic: negative for visual changes, syncope, or dizziness All other systems reviewed and are otherwise negative except as noted above.  Physical Exam: Blood pressure 127/85, pulse 69, temperature 98.9 F (37.2 C), temperature source Oral, resp. rate 20, height 5\' 7"  (1.702 m), weight 169 lb 8.5 oz (76.9 kg), SpO2 97.00%.  General: Well developed, well nourished, in no acute distress. Head: Normocephalic, atraumatic, sclera non-icteric, no xanthomas, nares are without discharge.  Neck: Supple without bruits or JVD. Lungs:  Resp regular and unlabored, CTA. Heart: RRR no s3, s4, or murmurs. Abdomen: Soft, non-tender, non-distended, BS + x 4.  Msk:  Strength and tone appears normal for age. Extremities: No clubbing, cyanosis or edema. DP/PT/Radials 2+ and equal bilaterally. Neuro: Alert and oriented X 3. Moves all extremities spontaneously. Psych: Normal affect.   Labs:   Results for  orders placed during the hospital encounter of 11/15/11 (from the past 72 hour(s))  POCT I-STAT TROPONIN I     Status: Normal   Collection Time   11/15/11  1:21 PM      Component Value Range Comment   Troponin i, poc 0.01  0.00 - 0.08 (ng/mL)    Comment 3            POCT I-STAT, CHEM 8     Status: Abnormal   Collection Time   11/15/11  1:23 PM      Component Value Range Comment   Sodium 140  135 - 145 (mEq/L)    Potassium 4.1  3.5 - 5.1 (mEq/L)    Chloride 104  96 - 112 (mEq/L)    BUN 5 (*) 6 - 23 (mg/dL)    Creatinine, Ser 1.61  0.50 - 1.35 (mg/dL)    Glucose, Bld 93  70 - 99 (mg/dL)    Calcium, Ion 0.96  1.12 - 1.32 (mmol/L)    TCO2 27  0 - 100 (mmol/L)    Hemoglobin 16.0  13.0 - 17.0 (g/dL)    HCT 04.5  40.9 - 81.1 (%)   URINE RAPID DRUG SCREEN (HOSP PERFORMED)     Status: Normal   Collection Time   11/15/11  6:01 PM      Component Value Range Comment   Opiates NONE DETECTED  NONE DETECTED     Cocaine NONE DETECTED  NONE DETECTED     Benzodiazepines NONE DETECTED  NONE DETECTED     Amphetamines NONE DETECTED  NONE DETECTED     Tetrahydrocannabinol NONE DETECTED  NONE DETECTED     Barbiturates NONE DETECTED  NONE DETECTED    CARDIAC PANEL(CRET KIN+CKTOT+MB+TROPI)     Status: Abnormal   Collection Time   11/15/11 10:05 PM      Component Value Range Comment   Total CK 240 (*) 7 - 232 (U/L)    CK, MB 2.3  0.3 - 4.0 (ng/mL)    Troponin I <0.30  <0.30 (ng/mL)    Relative Index 1.0  0.0 - 2.5    HEMOGLOBINOPATHY EVALUATION     Status: Normal   Collection Time   11/15/11 10:05 PM      Component Value Range Comment   Hgb A2 Quant 2.3  2.2 - 3.2 (%)    Hgb F Quant 0.0  0.0 - 2.0 (%)    Hgb S Quant 0.0  0.0 (%)    Hgb A 97.7  96.8 - 97.8 (%)    Hemoglobin Other 0.0  0.0 (%)   CARDIAC PANEL(CRET KIN+CKTOT+MB+TROPI)     Status: Normal   Collection Time   11/16/11  6:30 AM      Component Value Range Comment   Total CK 170  7 - 232 (U/L)    CK, MB 2.1  0.3 - 4.0 (ng/mL)     Troponin I <0.30  <0.30 (ng/mL)    Relative  Index 1.2  0.0 - 2.5    COMPREHENSIVE METABOLIC PANEL     Status: Normal   Collection Time   11/16/11  6:30 AM      Component Value Range Comment   Sodium 139  135 - 145 (mEq/L)    Potassium 4.1  3.5 - 5.1 (mEq/L)    Chloride 105  96 - 112 (mEq/L)    CO2 25  19 - 32 (mEq/L)    Glucose, Bld 88  70 - 99 (mg/dL)    BUN 7  6 - 23 (mg/dL)    Creatinine, Ser 4.09  0.50 - 1.35 (mg/dL)    Calcium 9.4  8.4 - 10.5 (mg/dL)    Total Protein 7.1  6.0 - 8.3 (g/dL)    Albumin 3.6  3.5 - 5.2 (g/dL)    AST 18  0 - 37 (U/L)    ALT 12  0 - 53 (U/L)    Alkaline Phosphatase 56  39 - 117 (U/L)    Total Bilirubin 0.9  0.3 - 1.2 (mg/dL)    GFR calc non Af Amer >90  >90 (mL/min)    GFR calc Af Amer >90  >90 (mL/min)   CBC     Status: Normal   Collection Time   11/16/11  6:30 AM      Component Value Range Comment   WBC 10.2  4.0 - 10.5 (K/uL)    RBC 4.90  4.22 - 5.81 (MIL/uL)    Hemoglobin 13.1  13.0 - 17.0 (g/dL) DELTA CHECK NOTED   HCT 40.5  39.0 - 52.0 (%)    MCV 82.7  78.0 - 100.0 (fL)    MCH 26.7  26.0 - 34.0 (pg)    MCHC 32.3  30.0 - 36.0 (g/dL)    RDW 81.1  91.4 - 78.2 (%)    Platelets 194  150 - 400 (K/uL)   MAGNESIUM     Status: Normal   Collection Time   11/16/11  6:30 AM      Component Value Range Comment   Magnesium 1.9  1.5 - 2.5 (mg/dL)   TSH     Status: Normal   Collection Time   11/16/11  6:30 AM      Component Value Range Comment   TSH 2.336  0.350 - 4.500 (uIU/mL)   CARDIAC PANEL(CRET KIN+CKTOT+MB+TROPI)     Status: Normal   Collection Time   11/16/11 12:57 PM      Component Value Range Comment   Total CK 151  7 - 232 (U/L)    CK, MB 1.9  0.3 - 4.0 (ng/mL)    Troponin I <0.30  <0.30 (ng/mL)    Relative Index 1.3  0.0 - 2.5      Radiology/Studies:   Ct Head Wo Contrast  11/15/2011  *RADIOLOGY REPORT*  Clinical Data: Loss of consciousness.  Hit back of head.  Denies headache.  CT HEAD WITHOUT CONTRAST  Technique:  Contiguous  axial images were obtained from the base of the skull through the vertex without contrast.  Comparison: None.  Findings: No acute cortical infarct, hemorrhage, mass lesion is present.  The ventricles are of normal size.  No significant extra- axial fluid collection is present.  Right parietal and occipital scalp soft tissue swelling is present. There is no underlying fracture.  A fluid level in the left maxillary sinus is of intermediate density.  Mucosal thickening is present in the right maxillary sinus.  There is opacification along the inferior aspect of  the right frontal sinus.  The mastoid air cells are clear.  IMPRESSION:  1.  Normal CT appearance the brain. 2.  Right occipital parietal scalp soft tissue swelling without underlying fracture. 3.  Left greater than right maxillary sinus disease.  Original Report Authenticated By: Jamesetta Orleans. MATTERN, M.D.   Dg Chest Port 1 View  11/15/2011  *RADIOLOGY REPORT*  Clinical Data: Syncopal episode.  No chest complaints  PORTABLE CHEST - 1 VIEW  Comparison: None.  Findings: Normal heart size with clear lung fields.  No bony abnormality.  IMPRESSION: Negative.  Original Report Authenticated By: Elsie Stain, M.D.   2D Echo 11/16/2011  Study Conclusions  - Left ventricle: The cavity size was normal. Wall thickness was increased in a pattern of mild LVH. Systolic function was normal. The estimated ejection fraction was in the range of 60% to 65%. There is an LV false tendon at the apex (normal variant). Wall motion was normal; there were no regional wall motion abnormalities. Left ventricular diastolic function parameters were normal. - Mitral valve: Mildly thickened leaflets . Trivial regurgitation. - Left atrium: The atrium was normal in size. - Systemic veins: The IVC measures >2.1 cm but collapses >50%, suggesting an elevated RA pressure of 10 mmHg.   EKG: SB,  57, TWI V1-V4.  ASSESSMENT AND PLAN:   1.  Abnormal EKG:  Pt with  abnormal EKG with TWI in anterior leads.  Will repeat now.  We do not have an old EKG to compare it with.  He has no prior history of chest pain or dyspnea and his echo shows normal LV function with NL wall motion.  His cardiac markers have been normal.  We will arrange for an outpatient Exercise Treadmill Test to further risk stratify.  2.  Syncope:  In setting of priapism.  Suspect he may have been acutely orthostatic.  NL EF.  No significant arrhythmias on the monitor.  ETT as above r/t abnl ekg.    3.  Priapism:  ? Etiology.  No evidence of sickle cell and pt denies usage of ED drugs.   Signed, Nicolasa Ducking, NP 11/17/2011, 2:45 PM   Patient seen and examined with Ward Givens, NP. We discussed all aspects of the encounter. I agree with the assessment and plan as stated above. ECG concerning but given normal echo and no cardiac symptoms or FHx for arrhythmias suspect this is benign repolarization variant. Will repeat ECG to make sure no change. Will check outpatient treadmill. Spoke with Dr. Graciela Husbands in EP who agrees and suggested consideration of cardiac MRI as outpatient to exclude infiltratvie process - which I fell is unlikely.   Truman Hayward 3:51 PM

## 2011-11-19 ENCOUNTER — Encounter: Payer: Self-pay | Admitting: Nurse Practitioner

## 2011-11-19 ENCOUNTER — Ambulatory Visit (INDEPENDENT_AMBULATORY_CARE_PROVIDER_SITE_OTHER): Payer: PRIVATE HEALTH INSURANCE | Admitting: Nurse Practitioner

## 2011-11-19 DIAGNOSIS — R55 Syncope and collapse: Secondary | ICD-10-CM

## 2011-11-19 NOTE — Patient Instructions (Signed)
We will be available as needed.   Call for any problems.

## 2011-11-19 NOTE — Progress Notes (Signed)
Exercise Treadmill Test  Pre-Exercise Testing Evaluation Rhythm: normal sinus  Rate: 88   PR:  .14 QRS:  .09  QT:  .34 QTc: .41     Test  Exercise Tolerance Test Ordering MD: Arvilla Meres, MD  Interpreting MD:  Norma Fredrickson NP  Unique Test No: 1  Treadmill:  1  Indication for ETT: Syncope  Contraindication to ETT: No   Stress Modality: exercise - treadmill  Cardiac Imaging Performed: non   Protocol: standard Bruce - maximal  Max BP:  190/82  Max MPHR (bpm):  198 85% MPR (bpm):  168  MPHR obtained (bpm):  173 % MPHR obtained:  87%  Reached 85% MPHR (min:sec): 12:17 Total Exercise Time (min-sec):  13:00  Workload in METS:  15.3 Borg Scale: 13  Reason ETT Terminated:  patient's desire to stop    ST Segment Analysis At Rest: normal ST segments - no evidence of significant ST depression With Exercise: no evidence of significant ST depression  Other Information Arrhythmia:  No Angina during ETT:  absent (0) Quality of ETT:  diagnostic  ETT Interpretation:  normal - no evidence of ischemia by ST analysis  Comments: Patient exercised on the standard Bruce Protocol for a total of 13 minutes for evaluation of prior syncope and abnormal EKG. Had inverted T waves in V1-4. EKG today is normal. Excellent exercise tolerance. Adequate blood pressure response. Clinically negative. EKG negative.  Recommendations: Will be available prn if problems arise in the future. I have discussed his case and reviewed the tracings with Dr. Antoine Poche (DOD). He is in agreement. No further testing warranted at this time unless the patient has further problems/symptoms.

## 2011-12-07 ENCOUNTER — Encounter: Payer: PRIVATE HEALTH INSURANCE | Admitting: Physician Assistant

## 2013-09-04 IMAGING — CR DG CHEST 1V PORT
1 series · 1 of 1 positions shown · non-contrast
Comparison: None.

CLINICAL DATA: Syncopal episode.  No chest complaints

PORTABLE CHEST - 1 VIEW

[view not recorded]
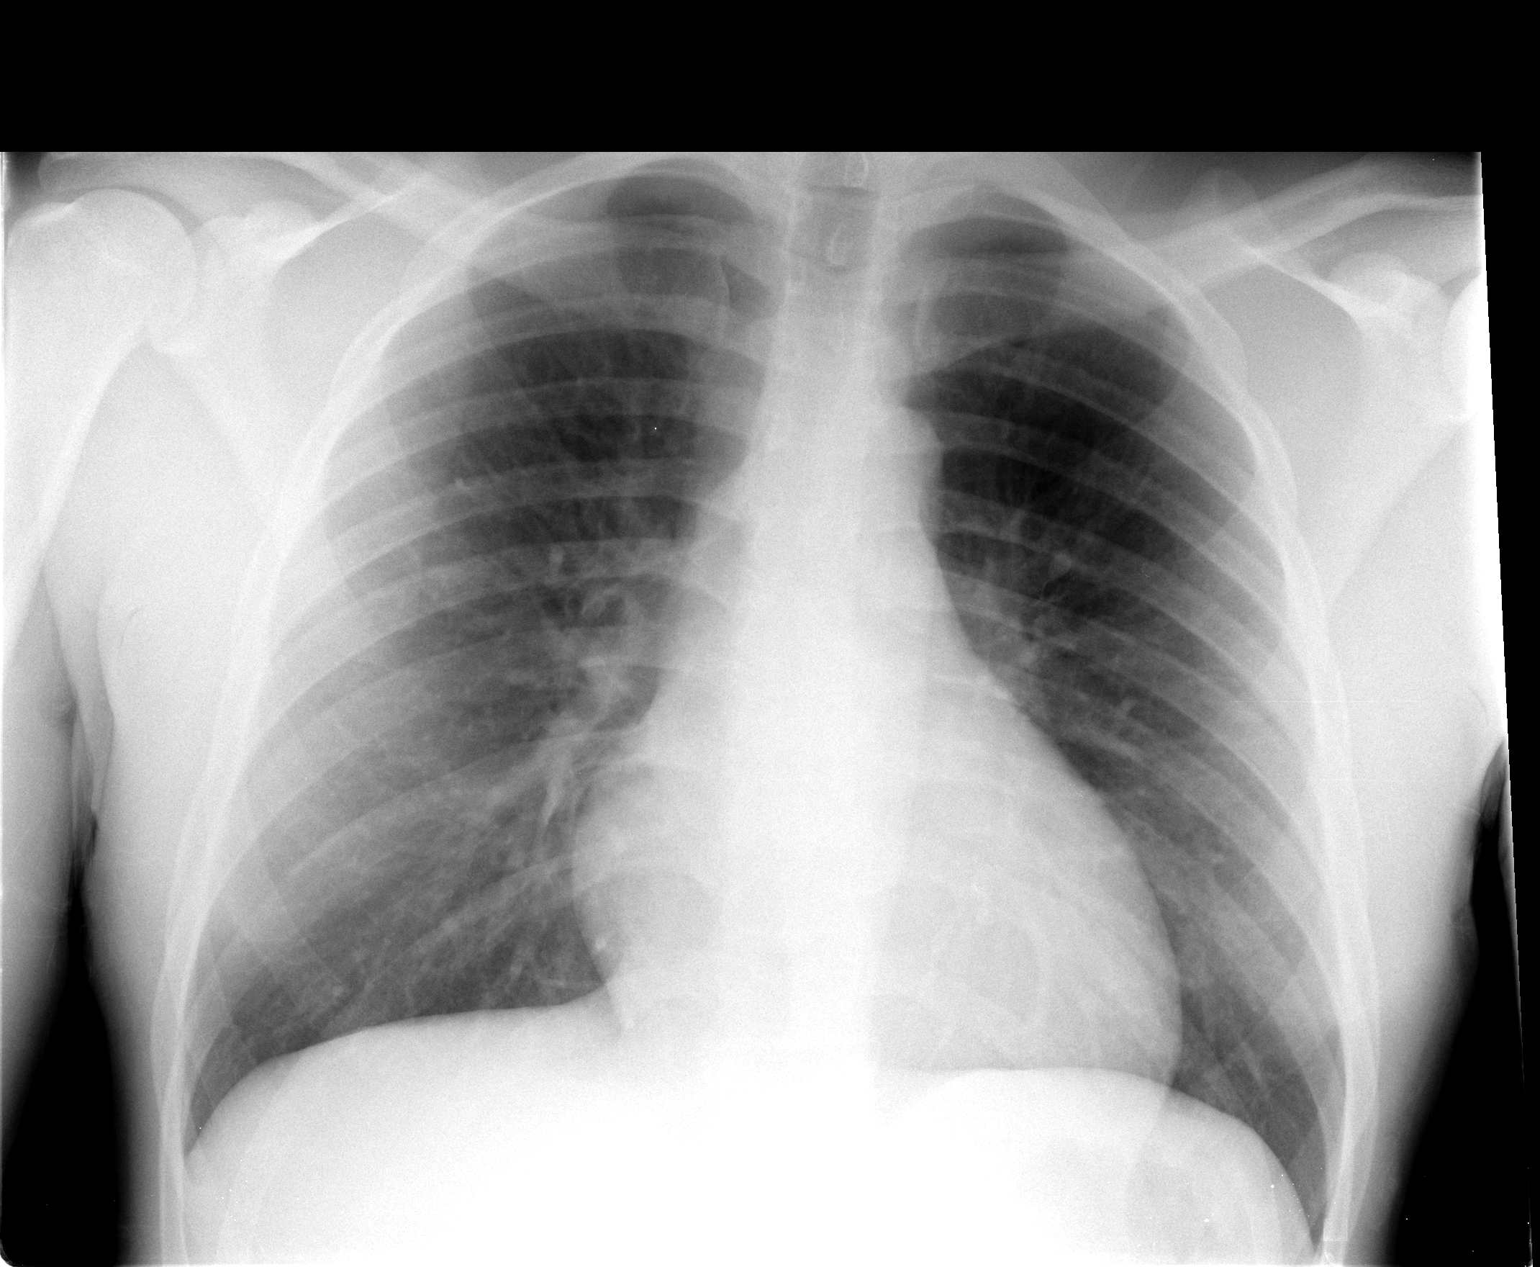

[1 of 1 positions shown; findings below may reference images not displayed]

FINDINGS: Normal heart size with clear lung fields.  No bony
abnormality.
IMPRESSION: Negative.

## 2013-09-04 IMAGING — CT CT HEAD W/O CM
2 series · 16 of 30 positions shown, 18 images · non-contrast
Comparison: None.

CLINICAL DATA: Loss of consciousness.  Hit back of head.  Denies
headache.

CT HEAD WITHOUT CONTRAST
TECHNIQUE: Contiguous axial images were obtained from the base of
the skull through the vertex without contrast.

[Series 2: head w/o · axial · non-contrast · 0.49mm/px · z∈[+150,+284]mm · 8 of 35 slices shown, 10 images]
[im 4/35  brain]
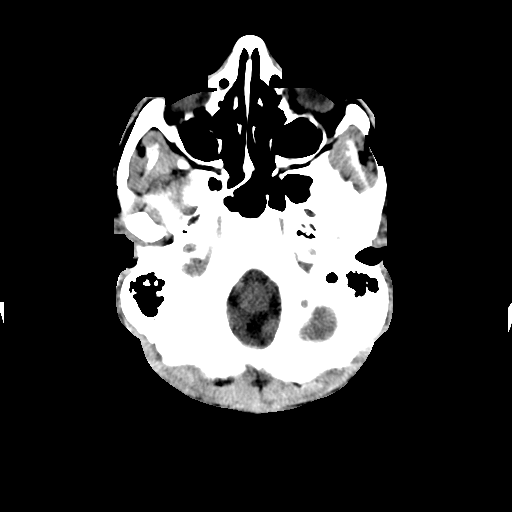
[im 4/35  bone]
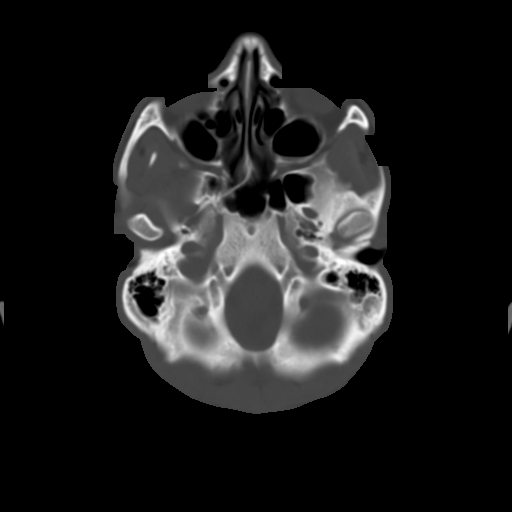
[im 8/35  brain]
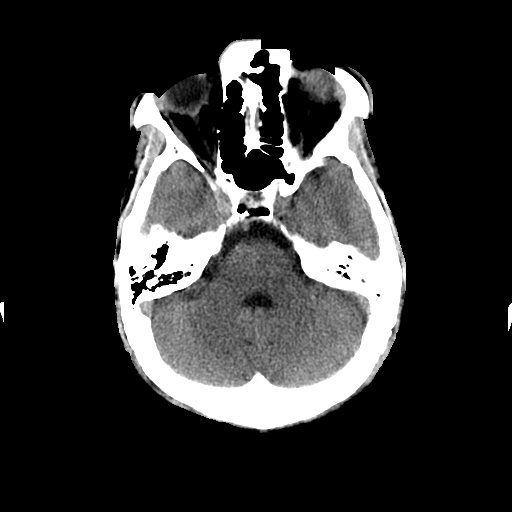
[im 12/35  brain]
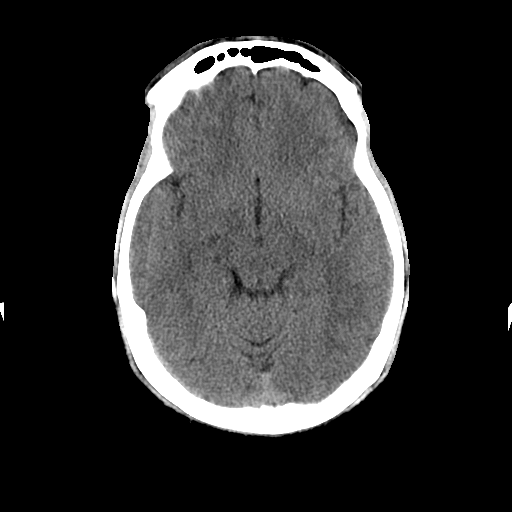
[im 16/35  brain]
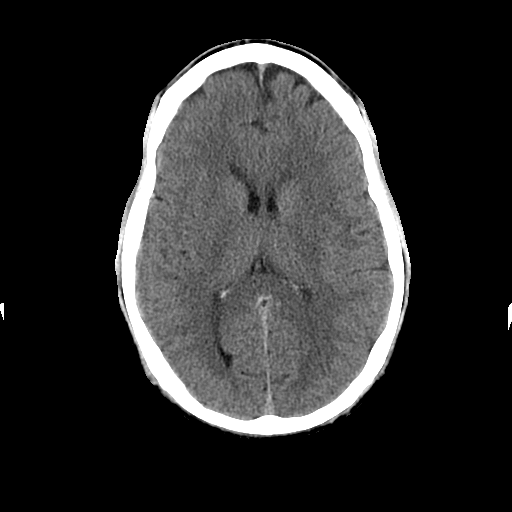
[im 19/35  brain]
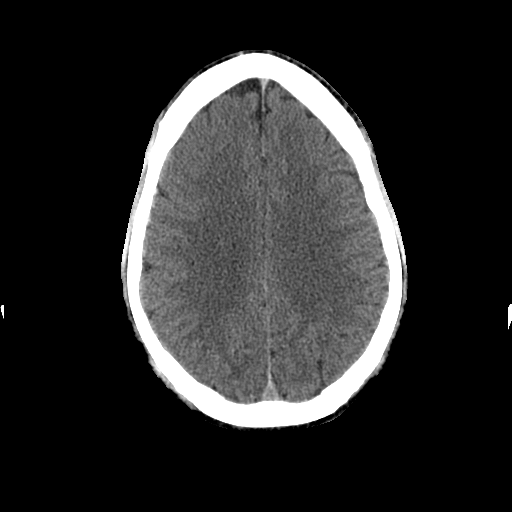
[im 19/35  bone]
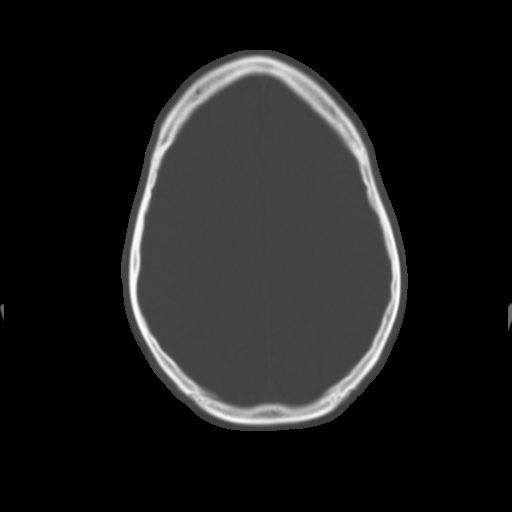
[im 23/35  brain]
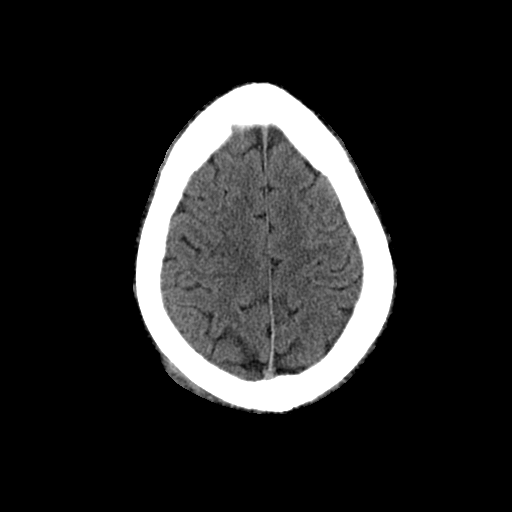
[im 27/35  brain]
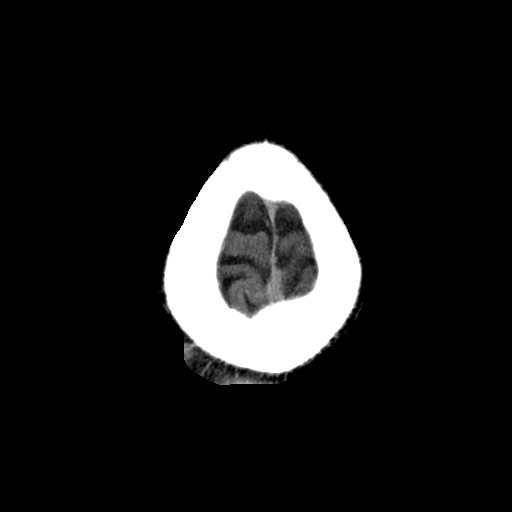
[im 31/35  brain]
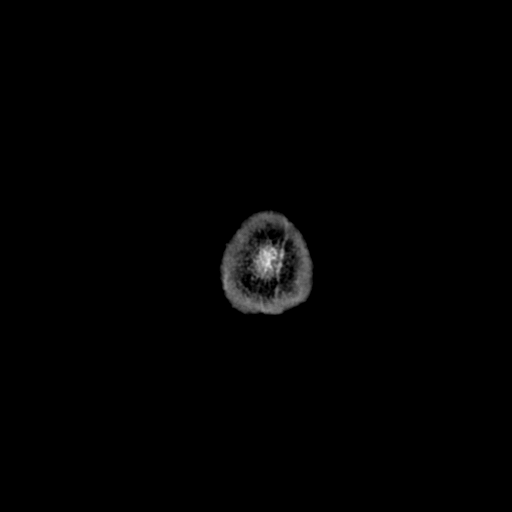

[Series 3: head w/o bone · axial · non-contrast · 0.49mm/px · z∈[+152,+284]mm · 8 of 69 slices shown]
[im 8/69  bone]
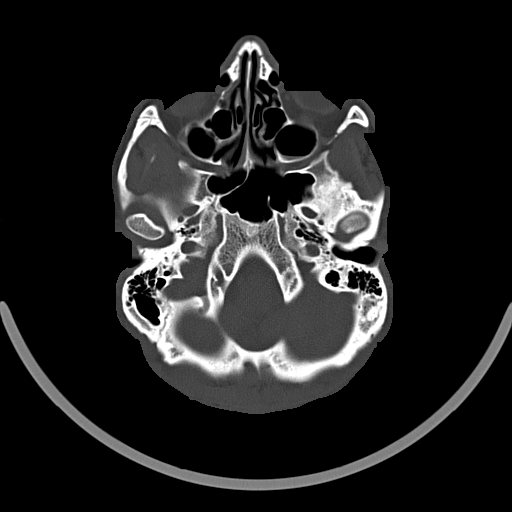
[im 15/69  bone]
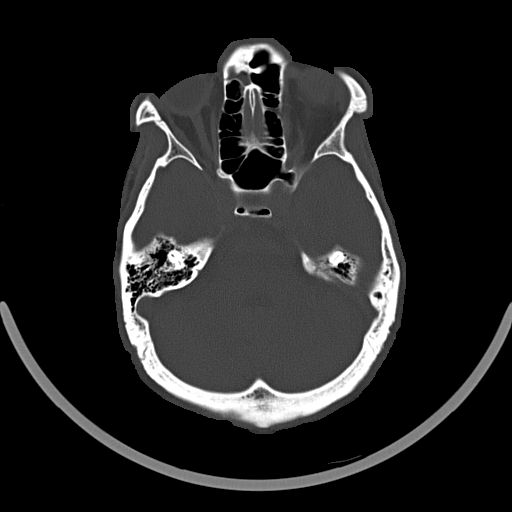
[im 22/69  bone]
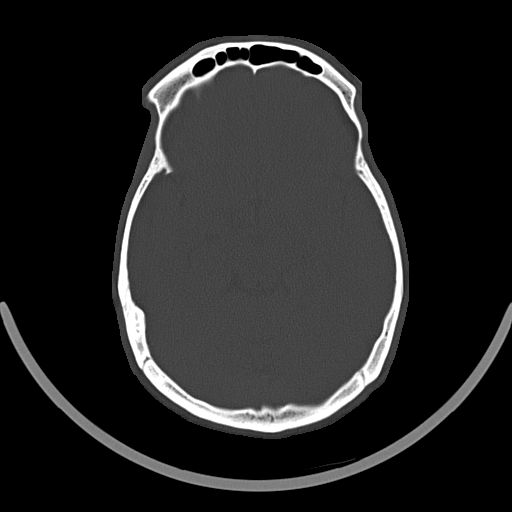
[im 29/69  bone]
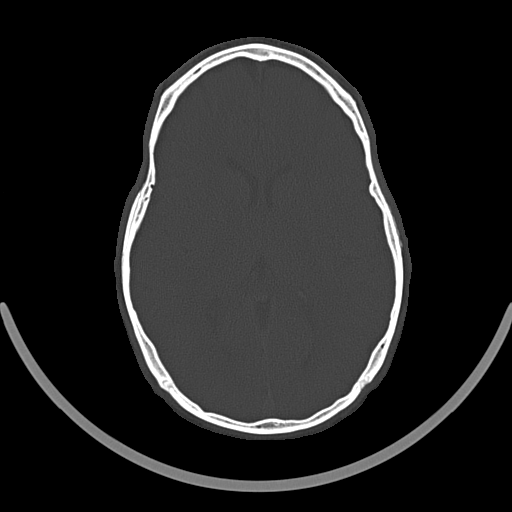
[im 40/69  bone]
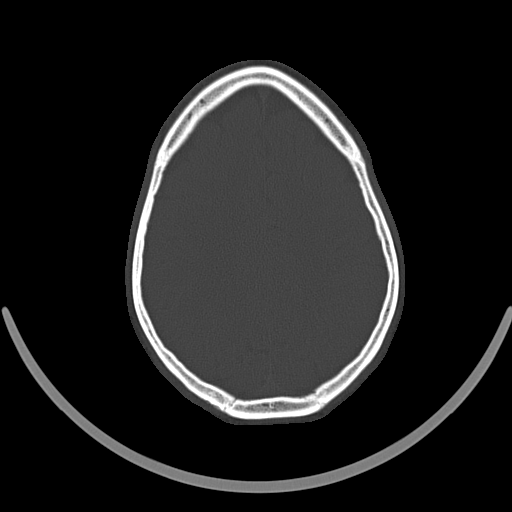
[im 47/69  bone]
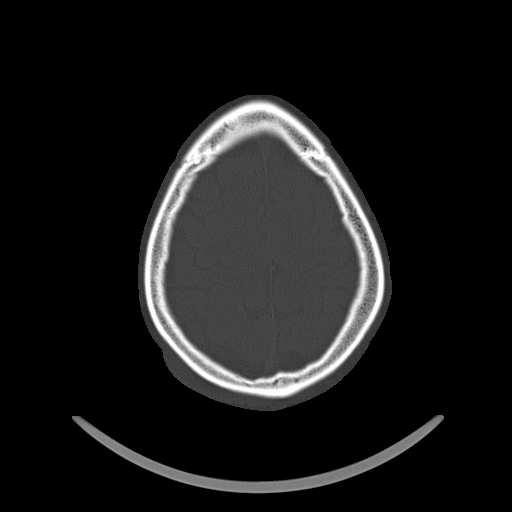
[im 54/69  bone]
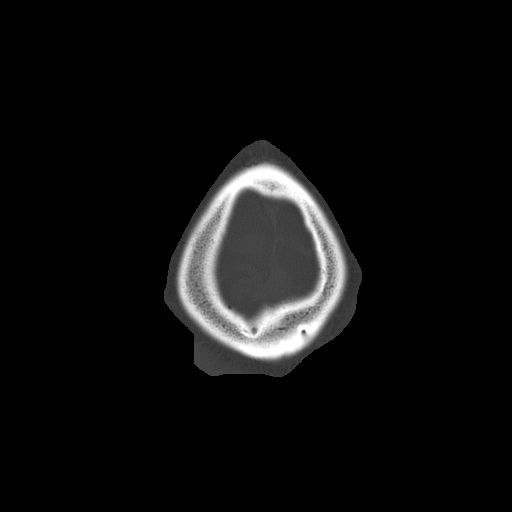
[im 61/69  bone]
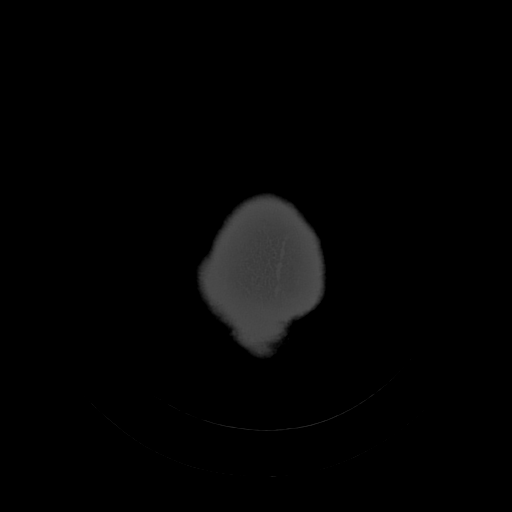

[16 of 30 positions shown; findings below may reference images not displayed]

FINDINGS: No acute cortical infarct, hemorrhage, mass lesion is
present.  The ventricles are of normal size.  No significant extra-
axial fluid collection is present.

Right parietal and occipital scalp soft tissue swelling is present.
There is no underlying fracture.  A fluid level in the left
maxillary sinus is of intermediate density.  Mucosal thickening is
present in the right maxillary sinus.  There is opacification along
the inferior aspect of the right frontal sinus.  The mastoid air
cells are clear.
IMPRESSION: 1.  Normal CT appearance the brain.
2.  Right occipital parietal scalp soft tissue swelling without
underlying fracture.
3.  Left greater than right maxillary sinus disease.

## 2014-02-04 ENCOUNTER — Encounter (HOSPITAL_COMMUNITY): Payer: Self-pay | Admitting: Emergency Medicine

## 2014-02-04 ENCOUNTER — Emergency Department (HOSPITAL_COMMUNITY)
Admission: EM | Admit: 2014-02-04 | Discharge: 2014-02-05 | Disposition: A | Discharge status: Home / Self Care | Payer: BC Managed Care – PPO | Attending: Emergency Medicine | Admitting: Emergency Medicine

## 2014-02-04 DIAGNOSIS — S01112A Laceration without foreign body of left eyelid and periocular area, initial encounter: Secondary | ICD-10-CM

## 2014-02-04 DIAGNOSIS — Y92838 Other recreation area as the place of occurrence of the external cause: Secondary | ICD-10-CM

## 2014-02-04 DIAGNOSIS — W219XXA Striking against or struck by unspecified sports equipment, initial encounter: Secondary | ICD-10-CM | POA: Insufficient documentation

## 2014-02-04 DIAGNOSIS — Y9367 Activity, basketball: Secondary | ICD-10-CM | POA: Insufficient documentation

## 2014-02-04 DIAGNOSIS — Y9239 Other specified sports and athletic area as the place of occurrence of the external cause: Secondary | ICD-10-CM | POA: Insufficient documentation

## 2014-02-04 DIAGNOSIS — Z87448 Personal history of other diseases of urinary system: Secondary | ICD-10-CM | POA: Insufficient documentation

## 2014-02-04 DIAGNOSIS — S0180XA Unspecified open wound of other part of head, initial encounter: Secondary | ICD-10-CM | POA: Insufficient documentation

## 2014-02-04 NOTE — Discharge Instructions (Signed)
Apply bacitracin to your eyebrow twice a day to prevent infection and scarring. Have sutures removed in 5-7 days. Return sooner if signs of infection develop.  Facial Laceration  A facial laceration is a cut on the face. These injuries can be painful and cause bleeding. Lacerations usually heal quickly, but they need special care to reduce scarring. DIAGNOSIS  Your health care provider will take a medical history, ask for details about how the injury occurred, and examine the wound to determine how deep the cut is. TREATMENT  Some facial lacerations may not require closure. Others may not be able to be closed because of an increased risk of infection. The risk of infection and the chance for successful closure will depend on various factors, including the amount of time since the injury occurred. The wound may be cleaned to help prevent infection. If closure is appropriate, pain medicines may be given if needed. Your health care provider will use stitches (sutures), wound glue (adhesive), or skin adhesive strips to repair the laceration. These tools bring the skin edges together to allow for faster healing and a better cosmetic outcome. If needed, you may also be given a tetanus shot. HOME CARE INSTRUCTIONS  Only take over-the-counter or prescription medicines as directed by your health care provider.  Follow your health care provider's instructions for wound care. These instructions will vary depending on the technique used for closing the wound. For Sutures:  Keep the wound clean and dry.   If you were given a bandage (dressing), you should change it at least once a day. Also change the dressing if it becomes wet or dirty, or as directed by your health care provider.   Wash the wound with soap and water 2 times a day. Rinse the wound off with water to remove all soap. Pat the wound dry with a clean towel.   After cleaning, apply a thin layer of the antibiotic ointment recommended by your  health care provider. This will help prevent infection and keep the dressing from sticking.   You may shower as usual after the first 24 hours. Do not soak the wound in water until the sutures are removed.   Get your sutures removed as directed by your health care provider. With facial lacerations, sutures should usually be taken out after 4 5 days to avoid stitch marks.   Wait a few days after your sutures are removed before applying any makeup. For Skin Adhesive Strips:  Keep the wound clean and dry.   Do not get the skin adhesive strips wet. You may bathe carefully, using caution to keep the wound dry.   If the wound gets wet, pat it dry with a clean towel.   Skin adhesive strips will fall off on their own. You may trim the strips as the wound heals. Do not remove skin adhesive strips that are still stuck to the wound. They will fall off in time.  For Wound Adhesive:  You may briefly wet your wound in the shower or bath. Do not soak or scrub the wound. Do not swim. Avoid periods of heavy sweating until the skin adhesive has fallen off on its own. After showering or bathing, gently pat the wound dry with a clean towel.   Do not apply liquid medicine, cream medicine, ointment medicine, or makeup to your wound while the skin adhesive is in place. This may loosen the film before your wound is healed.   If a dressing is placed over the wound,  be careful not to apply tape directly over the skin adhesive. This may cause the adhesive to be pulled off before the wound is healed.   Avoid prolonged exposure to sunlight or tanning lamps while the skin adhesive is in place.  The skin adhesive will usually remain in place for 5 10 days, then naturally fall off the skin. Do not pick at the adhesive film.  After Healing: Once the wound has healed, cover the wound with sunscreen during the day for 1 full year. This can help minimize scarring. Exposure to ultraviolet light in the first year  will darken the scar. It can take 1 2 years for the scar to lose its redness and to heal completely.  SEEK IMMEDIATE MEDICAL CARE IF:  You have redness, pain, or swelling around the wound.   You see ayellowish-white fluid (pus) coming from the wound.   You have chills or a fever.  MAKE SURE YOU:  Understand these instructions.  Will watch your condition.  Will get help right away if you are not doing well or get worse. Document Released: 12/23/2004 Document Revised: 09/05/2013 Document Reviewed: 06/28/2013 [facility] Patient Information 2014 [place], [place]. Laceration Care, Adult A laceration is a cut that goes through all layers of the skin. The cut goes into the tissue beneath the skin. HOME CARE For stitches (sutures) or staples:  Keep the cut clean and dry.  If you have a bandage (dressing), change it at least once a day. Change the bandage if it gets wet or dirty, or as told by your doctor.  Wash the cut with soap and water 2 times a day. Rinse the cut with water. Pat it dry with a clean towel.  Put a thin layer of medicated cream on the cut as told by your doctor.  You may shower after the first 24 hours. Do not soak the cut in water until the stitches are removed.  Only take medicines as told by your doctor.  Have your stitches or staples removed as told by your doctor. For skin adhesive strips:  Keep the cut clean and dry.  Do not get the strips wet. You may take a bath, but be careful to keep the cut dry.  If the cut gets wet, pat it dry with a clean towel.  The strips will fall off on their own. Do not remove the strips that are still stuck to the cut. For wound glue:  You may shower or take baths. Do not soak or scrub the cut. Do not swim. Avoid heavy sweating until the glue falls off on its own. After a shower or bath, pat the cut dry with a clean towel.  Do not put medicine on your cut until the glue falls off.  If you have a bandage, do not put tape  over the glue.  Avoid lots of sunlight or tanning lamps until the glue falls off. Put sunscreen on the cut for the first year to reduce your scar.  The glue will fall off on its own. Do not pick at the glue. You may need a tetanus shot if:  You cannot remember when you had your last tetanus shot.  You have never had a tetanus shot. If you need a tetanus shot and you choose not to have one, you may get tetanus. Sickness from tetanus can be serious. GET HELP RIGHT AWAY IF:   Your pain does not get better with medicine.  Your arm, hand, leg, or foot loses  feeling (numbness) or changes color.  Your cut is bleeding.  Your joint feels weak, or you cannot use your joint.  You have painful lumps on your body.  Your cut is red, puffy (swollen), or painful.  You have a red line on the skin near the cut.  You have yellowish-white fluid (pus) coming from the cut.  You have a fever.  You have a bad smell coming from the cut or bandage.  Your cut breaks open before or after stitches are removed.  You notice something coming out of the cut, such as wood or glass.  You cannot move a finger or toe. MAKE SURE YOU:   Understand these instructions.  Will watch your condition.  Will get help right away if you are not doing well or get worse. Document Released: 05/03/2008 Document Revised: 02/07/2012 Document Reviewed: 05/11/2011 [facility] Patient Information 2014 [place], [place].

## 2014-02-04 NOTE — ED Notes (Signed)
Patient playing basketball tonight, another player not watching where he was going and knocked into patient.  Patient with laceration over left eye, bleeding controlled.  Patient is CAOx3, no LOC.

## 2014-02-04 NOTE — ED Provider Notes (Signed)
CSN: [id number]     Arrival date & time 02/04/14  2044 History   None    This chart was scribed for non-physician practitioner, Antony Madura, PA-C working with Gavin Pound. Oletta Lamas, MD by Arlan Organ, ED Scribe. This patient was seen in room TR05C/TR05C and the patient's care was started at 10:52 PM.   Chief Complaint  Patient presents with  . Facial Laceration   The history is provided by the patient. No language interpreter was used.    HPI Comments: Joe Williamson is a 23 y.o. male who presents to the Emergency Department complaining of a laceration to the left eyebrow that occurred just prior to arrival. Pt states he was playing basketball this evening when he bumped into another player. He denies any LOC after the accident. Denies currently being on any blood thinners. At this time he denies any fever or chills. Last Tetanus shot 2012-2013. Pt has no pertinent medical history, and no other concerns this visit.   Past Medical History  Diagnosis Date  . 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 Syncope     11/16/2011 in setting of priapism  . Abnormal EKG     TWI V1-V4   History reviewed. No pertinent past surgical history. Family History  Problem Relation Age of Onset  . Diabetes type II Mother    History  Substance Use Topics  . Smoking status: Never Smoker   . Smokeless tobacco: Never Used  . Alcohol Use: Yes     Comment: occational    Review of Systems  Constitutional: Negative for fever and chills.  Skin: Positive for wound. Negative for rash.  Neurological: Negative for dizziness and light-headedness.     Allergies  Review of patient's allergies indicates no known allergies.  Home Medications  No current outpatient prescriptions on file.  Triage Vitals: BP 140/86  Pulse 63  Temp(Src) 98.1 F (36.7 C) (Oral)  Resp 18  SpO2 98%   Physical Exam  Nursing note and vitals reviewed. Constitutional: He is oriented to person, place, and time. He appears well-developed and  well-nourished. No distress.  HENT:  Head: Normocephalic and atraumatic.  Eyes: Conjunctivae and EOM are normal. Pupils are equal, round, and reactive to light. No scleral icterus.  All visual fields intact. No conjunctival hemorrhage or injection b/l.  Neck: Normal range of motion.  Pulmonary/Chest: Effort normal. No respiratory distress.  Musculoskeletal: Normal range of motion.  Neurological: He is alert and oriented to person, place, and time.  Skin: Skin is warm and dry. No rash noted. He is not diaphoretic. No erythema. No pallor.  3cm laceration to mid/lateral L eyebrow  Psychiatric: He has a normal mood and affect. His behavior is normal.    ED Course  Procedures (including critical care time)  DIAGNOSTIC STUDIES: Oxygen Saturation is 98% on RA, Normal by my interpretation.    COORDINATION OF CARE: 10:54 PM- Will perform laceration repair. Discussed treatment plan with pt at bedside and pt agreed to plan.     Labs Review Labs Reviewed - No data to display Imaging Review No results found.   EKG Interpretation None      LACERATION REPAIR Performed by: Antony Madura Authorized by: Antony Madura Consent: Verbal consent obtained. Risks and benefits: risks, benefits and alternatives were discussed Consent given by: patient Patient identity confirmed: provided demographic data Prepped and Draped in normal sterile fashion Wound explored  Laceration Location: L eyebrow  Laceration Length: 3cm  No Foreign Bodies seen or  palpated  Anesthesia: local infiltration  Local anesthetic: lidocaine 2% without epinephrine  Anesthetic total: 3 ml  Irrigation method: syringe Amount of cleaning: standard  Skin closure: 5-0 prolene  Number of sutures: 6  Technique: simple interrupted  Patient tolerance: Patient tolerated the procedure well with no immediate complications.  MDM   Final diagnoses:  Laceration of eyebrow, left    Uncomplicated eyebrow laceration. No  facial/eyebrow drooping noted; no exposed muscle on examination. EOMs intact. Patient denies loss of consciousness and vision loss. Tetanus up-to-date. Laceration repaired in ED with 6 Prolene sutures. Patient stable and appropriate for discharge with instruction to return in 5-7 days for suture removal. Return precautions discussed and patient agreeable to plan with no unaddressed concerns.  I personally performed the services described in this documentation, which was scribed in my presence. The recorded information has been reviewed and is accurate.    Filed Vitals:   02/04/14 2054  BP: 140/86  Pulse: 63  Temp: 98.1 F (36.7 C)  TempSrc: Oral  Resp: 18  SpO2: 98%     Antony MaduraKelly Malori Myers, PA-C 02/04/14 2337

## 2014-02-04 NOTE — ED Notes (Signed)
PA at bedside to suture.

## 2014-02-06 NOTE — ED Provider Notes (Signed)
Medical screening examination/treatment/procedure(s) were performed by non-physician practitioner and as supervising physician I was immediately available for consultation/collaboration.   EKG Interpretation None        Jefry Lesinski Y. Penney Domanski, MD 02/06/14 2037 

## 2014-11-15 ENCOUNTER — Other Ambulatory Visit (HOSPITAL_COMMUNITY)
Admission: RE | Admit: 2014-11-15 | Discharge: 2014-11-15 | Disposition: A | Discharge status: Home / Self Care | Payer: Self-pay | Source: Ambulatory Visit | Attending: Family Medicine | Admitting: Family Medicine

## 2014-11-15 ENCOUNTER — Emergency Department (INDEPENDENT_AMBULATORY_CARE_PROVIDER_SITE_OTHER)
Admission: EM | Admit: 2014-11-15 | Discharge: 2014-11-15 | Disposition: A | Discharge status: Home / Self Care | Payer: Self-pay | Source: Home / Self Care | Attending: Family Medicine | Admitting: Family Medicine

## 2014-11-15 ENCOUNTER — Encounter (HOSPITAL_COMMUNITY): Payer: Self-pay | Admitting: Emergency Medicine

## 2014-11-15 DIAGNOSIS — Z113 Encounter for screening for infections with a predominantly sexual mode of transmission: Secondary | ICD-10-CM | POA: Insufficient documentation

## 2014-11-15 DIAGNOSIS — N342 Other urethritis: Secondary | ICD-10-CM

## 2014-11-15 LAB — RPR

## 2014-11-15 LAB — HIV ANTIBODY (ROUTINE TESTING W REFLEX): HIV: NONREACTIVE

## 2014-11-15 MED ORDER — AZITHROMYCIN 250 MG PO TABS
1000.0000 mg | ORAL_TABLET | Freq: Once | ORAL | Status: AC
  Administered 2014-11-15: 1000 mg via ORAL

## 2014-11-15 MED ORDER — LIDOCAINE HCL (PF) 1 % IJ SOLN
INTRAMUSCULAR | Status: AC
  Filled 2014-11-15: qty 5

## 2014-11-15 MED ORDER — CEFTRIAXONE SODIUM 250 MG IJ SOLR
250.0000 mg | Freq: Once | INTRAMUSCULAR | Status: AC
  Administered 2014-11-15: 250 mg via INTRAMUSCULAR

## 2014-11-15 MED ORDER — AZITHROMYCIN 250 MG PO TABS
ORAL_TABLET | ORAL | Status: AC
  Filled 2014-11-15: qty 4

## 2014-11-15 MED ORDER — CEFTRIAXONE SODIUM 250 MG IJ SOLR
INTRAMUSCULAR | Status: AC
  Filled 2014-11-15: qty 250

## 2014-11-15 NOTE — ED Notes (Signed)
Reports he rec'd a call from his partner; she is being treated for chlamydia He is asymptomatic Alert, no signs of acute distress.

## 2014-11-15 NOTE — ED Provider Notes (Addendum)
Joe KennedyReginald I Williamson is a 23 y.o. male who presents to Urgent Care today for Chlamydia exposure. Patient's sexual partner tested positive for Chlamydia. He is here for treatment. He is asymptomatic. He feels well otherwise.   Past Medical History  Diagnosis Date  . 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 Syncope     11/16/2011 in setting of priapism  . Abnormal EKG     TWI V1-V4   History reviewed. No pertinent past surgical history. History  Substance Use Topics  . Smoking status: Never Smoker   . Smokeless tobacco: Never Used  . Alcohol Use: Yes     Comment: occational   ROS as above Medications: Current Facility-Administered Medications  Medication Dose Route Frequency Provider Last Rate Last Dose  . azithromycin (ZITHROMAX) tablet 1,000 mg  1,000 mg Oral Once Rodolph BongEvan S Corey, MD      . cefTRIAXone (ROCEPHIN) injection 250 mg  250 mg Intramuscular Once Rodolph BongEvan S Corey, MD       No current outpatient prescriptions on file.   No Known Allergies   Exam:  BP 126/79 mmHg  Pulse 59  Temp(Src) 98.4 F (36.9 C) (Oral)  Resp 16  SpO2 99% Gen: Well NAD Genitals: No inguinal lymphadenopathy. Testicles are descended bilaterally nontender. Penis is normal appearing circumcised with small amount of clear discharge.  Patient was given 250 mg IM ceftriaxone, and 1 g by mouth azithromycin  No results found for this or any previous visit (from the past 24 hour(s)). No results found.  Assessment and Plan: 23 y.o. male with urethritis secondary to chlamydia exposure. Urine cytology pending for gonorrhea Trichomonas and Chlamydia. Additionally serology pending for HIV and syphilis. Treatment with ceftriaxone and azithromycin.  Discussed warning signs or symptoms. Please see discharge instructions. Patient expresses understanding.     Rodolph BongEvan S Corey, MD 11/15/14 1723  Rodolph BongEvan S Corey, MD 11/15/14 [phone]

## 2014-11-15 NOTE — Discharge Instructions (Signed)
Thank you for coming in today. Chlamydia Chlamydia is an infection. It is spread through sexual contact. Chlamydia can be in different areas of the body. These areas include the urethra, throat, or rectum. It is important to treat chlamydia as soon as possible. It can damage other organs.  CAUSES  Chlamydia is caused by bacteria. It is a sexually transmitted disease. This means that it is passed from an infected partner during intimate contact. This contact could be with the genitals, mouth, or rectal area.  SIGNS AND SYMPTOMS  There may not be any symptoms. This is often the case early in the infection. If there are symptoms, they are usually mild and may only be noticeable in the morning. Symptoms you may notice include:   Burning with urination.  Pain or swelling in the testicles.  Watery mucus-like discharge from the penis.  Long-standing (chronic) pelvic pain after frequent infections.  Pain, swelling, or itching around the anus.  A sore throat.  Itching, burning, or redness in the eyes, or discharge from the eyes. DIAGNOSIS  To diagnose this infection, your health care provider will do a pelvic exam. A sample of urine or a swab from the rectum may be taken for testing.  TREATMENT  Chlamydia is treated with antibiotic medicines.  HOME CARE INSTRUCTIONS  Take your antibiotic medicine as directed by your health care provider. Finish the antibiotic even if you start to feel better. Incomplete treatment will put you at risk for not being able to have children (sterility).   Take medicines only as directed by your health care provider.   Rest.   Inform any sexual partners about your infection. Even if they are symptom free or have a negative culture or evaluation, they should be treated for the condition.   Do not have sex (intercourse) until treatment is completed and your health care provider says it is okay.   Keep all follow-up visits as directed by your health care  provider.   Not all test results are available during your visit. If your test results are not back during the visit, make an appointment with your health care provider to find out the results. Do not assume everything is normal if you have not heard from your health care provider or the medical facility. It is your responsibility to get your test results. SEEK MEDICAL CARE IF:  You develop new joint pain.  You have a fever. SEEK IMMEDIATE MEDICAL CARE IF:   Your pain increases.   You have abnormal discharge.   You have pain during intercourse. MAKE SURE YOU:   Understand these instructions.  Will watch your condition.  Will get help right away if you are not doing well or get worse. Document Released: 11/15/2005 Document Revised: 04/01/2014 Document Reviewed: 05/24/2013 [facility] Patient Information 2015 [place], [place]. This information is not intended to replace advice given to you by your health care provider. Make sure you discuss any questions you have with your health care provider.   Safe Sex Safe sex is about reducing the risk of giving or getting a sexually transmitted disease (STD). STDs are spread through sexual contact involving the genitals, mouth, or rectum. Some STDs can be cured and others cannot. Safe sex can also prevent unintended pregnancies.  WHAT ARE SOME SAFE SEX PRACTICES?  Limit your sexual activity to only one partner who is having sex with only you.  Talk to your partner about his or her past partners, past STDs, and drug use.  Use a  condom every time you have sexual intercourse. This includes vaginal, oral, and anal sexual activity. Both females and males should wear condoms during oral sex. Only use latex or polyurethane condoms and water-based lubricants. Using petroleum-based lubricants or oils to lubricate a condom will weaken the condom and increase the chance that it will break. The condom should be in place from the beginning to the end of  sexual activity. Wearing a condom reduces, but does not completely eliminate, your risk of getting or giving an STD. STDs can be spread by contact with infected body fluids and skin.  Get vaccinated for hepatitis B and HPV.  Avoid alcohol and recreational drugs, which can affect your judgment. You may forget to use a condom or participate in high-risk sex.  For females, avoid douching after sexual intercourse. Douching can spread an infection farther into the reproductive tract.  Check your body for signs of sores, blisters, rashes, or unusual discharge. See your health care provider if you notice any of these signs.  Avoid sexual contact if you have symptoms of an infection or are being treated for an STD. If you or your partner has herpes, avoid sexual contact when blisters are present. Use condoms at all other times.  If you are at risk of being infected with HIV, it is recommended that you take a prescription medicine daily to prevent HIV infection. This is called pre-exposure prophylaxis (PrEP). You are considered at risk if:  You are a man who has sex with other men (MSM).  You are a heterosexual man or woman who is sexually active with more than one partner.  You take drugs by injection.  You are sexually active with a partner who has HIV.  Talk with your health care provider about whether you are at high risk of being infected with HIV. If you choose to begin PrEP, you should first be tested for HIV. You should then be tested every 3 months for as long as you are taking PrEP.  See your health care provider for regular screenings, exams, and tests for other STDs. Before having sex with a new partner, each of you should be screened for STDs and should talk about the results with each other. WHAT ARE THE BENEFITS OF SAFE SEX?   There is less chance of getting or giving an STD.  You can prevent unwanted or unintended pregnancies.  By discussing safe sex concerns with your partner,  you may increase feelings of intimacy, comfort, trust, and honesty between the two of you. Document Released: 12/23/2004 Document Revised: 04/01/2014 Document Reviewed: 05/08/2012 [place] Patient Information 2015 [place]Care, [place]LL[place]. This information is not intended to replace advice given to you by your health care provider. Make sure you discuss any questions you have with your health care provider.

## 2014-11-18 LAB — URINE CYTOLOGY ANCILLARY ONLY
Chlamydia: POSITIVE — AB
Neisseria Gonorrhea: POSITIVE — AB
Trichomonas: NEGATIVE

## 2014-11-19 ENCOUNTER — Telehealth (HOSPITAL_COMMUNITY): Payer: Self-pay | Admitting: *Deleted

## 2014-11-19 NOTE — ED Notes (Signed)
Pt. called back.  Pt. verified x 2 and given results.  Pt. told he was adequately treated for both infections while he was here.  Pt. instructed to notify his partner, no sex for 1 week and to practice safe sex. Pt. told [facility].  Pt. voiced understanding.  [facility]HS forms x 2 completed [facility]

## 2014-11-19 NOTE — ED Notes (Signed)
GC and Chlamydia pos., Trich neg.  Pt. adequately treated with Rocephin and Zithromax.  I called pt. and left a message to call. Call 1. Joe Williamson, Joe Williamson M 11/19/2014

## 2016-11-20 ENCOUNTER — Encounter (HOSPITAL_COMMUNITY): Payer: Self-pay | Admitting: *Deleted

## 2016-11-20 ENCOUNTER — Emergency Department (HOSPITAL_COMMUNITY)
Admission: EM | Admit: 2016-11-20 | Discharge: 2016-11-20 | Disposition: A | Discharge status: Home / Self Care | Payer: Self-pay | Attending: Emergency Medicine | Admitting: Emergency Medicine

## 2016-11-20 DIAGNOSIS — L0291 Cutaneous abscess, unspecified: Secondary | ICD-10-CM

## 2016-11-20 DIAGNOSIS — L0231 Cutaneous abscess of buttock: Secondary | ICD-10-CM | POA: Insufficient documentation

## 2016-11-20 MED ORDER — LIDOCAINE-EPINEPHRINE (PF) 2 %-1:200000 IJ SOLN
20.0000 mL | Freq: Once | INTRAMUSCULAR | Status: AC
  Administered 2016-11-20: 20 mL
  Filled 2016-11-20: qty 20

## 2016-11-20 MED ORDER — SULFAMETHOXAZOLE-TRIMETHOPRIM 800-160 MG PO TABS: 1.0000 | ORAL_TABLET | Freq: Two times a day (BID) | ORAL | 0 refills | Status: AC

## 2016-11-20 NOTE — ED Triage Notes (Signed)
Pt stated "I think I have an abscess on my right butt cheek.  I tried a warm, wet cloth but I can't sleep."

## 2016-11-20 NOTE — Discharge Instructions (Signed)
Please take all of your antibiotics until finished! Please follow up with your doctor, an urgent care, or return to ED for wound check in 2-3 days. Return to the emergency department sooner if you have  a fever that persists greater than 101 or your abscess appears to become infected (growing surrounding redness and warmth).   SEEK MEDICAL CARE IF:  You develop increased pain, swelling, redness, drainage, or bleeding in the wound site.  You develop signs of generalized infection including muscle aches, chills, fever, or a general ill feeling.  You have an oral temperature above 102 F (38.9 C).  MAKE SURE YOU:  Understand these instructions.  Will watch your condition.  Will get help right away if you are not doing well or get worse.  Document Released: 08/25/2005 Document Revised: 07/28/2011 Document Reviewed: 06/18/2008 [facility] Patient Information 2012 [place]Exit[place], [place]LL[place].

## 2016-11-20 NOTE — ED Provider Notes (Signed)
[facility]-EMERGENCY DEPT Provider Note   CSN: [id number] Arrival date & time: 11/20/16  0242     History   Chief Complaint Chief Complaint  Patient presents with  . Abscess    HPI Joe Williamson is a 25 y.o. male.  The history is provided by the patient and medical records. No language interpreter was used.  Abscess  Associated symptoms: no fever, no headaches, no nausea and no vomiting    Joe Williamson is a 25 y.o. male  who presents to the Emergency Department complaining of tender area to the left buttocks which he first noticed yesterday and has progressively worsened. No medications taken prior to arrival for symptoms. No history of similar symptoms. Pain is worse when sitting down on the area or with palpation. No fever/chills. No dysuria, constipation, diarrhea, abdominal pain.  Past Medical History:  Diagnosis Date  . Abnormal EKG    TWI V1-V4  . Priapism    11/15/2011  . Syncope    11/16/2011 in setting of priapism    Patient Active Problem List   Diagnosis Date Noted  . Abnormal EKG 11/15/2011    History reviewed. No pertinent surgical history.     Home Medications    Prior to Admission medications   Medication Sig Start Date End Date Taking? Authorizing Provider  sulfamethoxazole-trimethoprim (BACTRIM DS,SEPTRA DS) 800-160 MG tablet Take 1 tablet by mouth 2 (two) times daily. 11/20/16 11/27/16  Chase PicketJaime Pilcher Ward, PA-C    Family History Family History  Problem Relation Age of Onset  . Diabetes type II Mother     Social History Social History  Substance Use Topics  . Smoking status: Never Smoker  . Smokeless tobacco: Never Used  . Alcohol use Yes     Comment: ocassionally     Allergies   Patient has no known allergies.   Review of Systems Review of Systems  Constitutional: Negative for chills and fever.  HENT: Negative for congestion.   Eyes: Negative for visual disturbance.  Respiratory: Negative for cough and shortness  of breath.   Cardiovascular: Negative.   Gastrointestinal: Negative for abdominal pain, nausea and vomiting.  Genitourinary: Negative for dysuria.  Musculoskeletal: Negative for back pain and neck pain.  Skin: Positive for wound.  Neurological: Negative for headaches.     Physical Exam Updated Vital Signs BP 133/88 (BP Location: Left Arm)   Pulse (!) 59   Temp 98.3 F (36.8 C) (Oral)   Resp 18   Ht 5\' 7"  (1.702 m)   Wt 81.6 kg   SpO2 99%   BMI 28.19 kg/m   Physical Exam  Constitutional: He is oriented to person, place, and time. He appears well-developed and well-nourished. No distress.  HENT:  Head: Normocephalic and atraumatic.  Cardiovascular: Normal rate, regular rhythm and normal heart sounds.   No murmur heard. Pulmonary/Chest: Effort normal and breath sounds normal. No respiratory distress.  Abdominal: Soft. He exhibits no distension. There is no tenderness.  Musculoskeletal: He exhibits no edema.  Neurological: He is alert and oriented to person, place, and time.  Skin: Skin is warm and dry.  1cm skin wound with small amount of purulent drainage with surrounding skin induration. Tender, but no warmth to the touch.   Nursing note and vitals reviewed.    ED Treatments / Results  Labs (all labs ordered are listed, but only abnormal results are displayed) Labs Reviewed - No data to display  EKG  EKG Interpretation None  Radiology No results found.  Procedures Procedures (including critical care time)  INCISION AND DRAINAGE Performed by: Chase PicketJaime Pilcher Ward Consent: Verbal consent obtained. Risks and benefits: risks, benefits and alternatives were discussed Type: abscess Body area: left buttock Anesthesia: local infiltration Incision was made with a scalpel. Local anesthetic: lidocaine 2% wtih epinephrine Anesthetic total: 6 ml Complexity: complex Blunt dissection to break up loculations Drainage: purulent Drainage amount: small Packing  material: 1/4 in iodoform gauze Patient tolerance: Patient tolerated the procedure well with no immediate complications.   Medications Ordered in ED Medications  lidocaine-EPINEPHrine (XYLOCAINE W/EPI) 2 %-1:200000 (PF) injection 20 mL (20 mLs Other Given by Other 11/20/16 0840)     Initial Impression / Assessment and Plan / ED Course  I have reviewed the triage vital signs and the nursing notes.  Pertinent labs & imaging results that were available during my care of the patient were reviewed by me and considered in my medical decision making (see chart for details).  Clinical Course    Patient presenting with abscess to the left buttock requiring incision and drainage.  Incision and drainage performed per procedure note. Small amount of purulent drainage expressed. Patient tolerated the procedure well. Significant amount of induration to the surrounding area. Will treat with bactrim. Wound care instructions discussed. Significant amount of time was taken to stress the importance of returning to the ER in 2-3 days for wound check. Also stressed the importance of having wound care and reasons to return to the ER, specifically spread of infection, increasing pain, fevers or other concerns. Discussed with patient that I would have a low threshold to return given the amount of induration. Patient expressed understanding and agrees with plan as dictated above. All questions answered.    Final Clinical Impressions(s) / ED Diagnoses   Final diagnoses:  Abscess    New Prescriptions Discharge Medication List as of 11/20/2016  8:56 AM    START taking these medications   Details  sulfamethoxazole-trimethoprim (BACTRIM DS,SEPTRA DS) 800-160 MG tablet Take 1 tablet by mouth 2 (two) times daily., Starting Sat 11/20/2016, Until Sat 11/27/2016, Print         CIT GroupJaime Pilcher Ward, PA-C 11/20/16 1310    Lorre NickAnthony Allen, MD 11/20/16 1525

## 2016-12-10 ENCOUNTER — Emergency Department (HOSPITAL_COMMUNITY)
Admission: EM | Admit: 2016-12-10 | Discharge: 2016-12-10 | Disposition: A | Discharge status: Home / Self Care | Payer: Self-pay | Attending: Emergency Medicine | Admitting: Emergency Medicine

## 2016-12-10 ENCOUNTER — Encounter (HOSPITAL_COMMUNITY): Payer: Self-pay | Admitting: Emergency Medicine

## 2016-12-10 ENCOUNTER — Emergency Department (HOSPITAL_COMMUNITY): Payer: Self-pay

## 2016-12-10 DIAGNOSIS — J189 Pneumonia, unspecified organism: Secondary | ICD-10-CM

## 2016-12-10 DIAGNOSIS — Z79899 Other long term (current) drug therapy: Secondary | ICD-10-CM | POA: Insufficient documentation

## 2016-12-10 DIAGNOSIS — J181 Lobar pneumonia, unspecified organism: Secondary | ICD-10-CM | POA: Insufficient documentation

## 2016-12-10 MED ORDER — PREDNISONE 10 MG (21) PO TBPK: ORAL_TABLET | ORAL | 0 refills | Status: DC

## 2016-12-10 MED ORDER — NAPROXEN 500 MG PO TABS: 500.0000 mg | ORAL_TABLET | Freq: Two times a day (BID) | ORAL | 0 refills | Status: DC

## 2016-12-10 MED ORDER — BENZONATATE 100 MG PO CAPS: 100.0000 mg | ORAL_CAPSULE | Freq: Three times a day (TID) | ORAL | 0 refills | Status: DC

## 2016-12-10 MED ORDER — AZITHROMYCIN 250 MG PO TABS: 250.0000 mg | ORAL_TABLET | Freq: Every day | ORAL | 0 refills | Status: DC

## 2016-12-10 MED ORDER — ALBUTEROL SULFATE HFA 108 (90 BASE) MCG/ACT IN AERS: 2.0000 | INHALATION_SPRAY | RESPIRATORY_TRACT | 0 refills | Status: DC | PRN

## 2016-12-10 NOTE — ED Provider Notes (Signed)
[facility]-EMERGENCY DEPT Provider Note   CSN: [id number] Arrival date & time: 12/10/16  1849     History   Chief Complaint Chief Complaint  Patient presents with  . Flu Like Symptoms    HPI Joe Williamson is a 26 y.o. male.  HPI   Joe Williamson is a 26 y.o. male, Patient with no pertinent past medical history, presenting to the ED with cough, congestion, chills, nausea, and body aches for the last 5 days. Productive cough with dark red to brown sputum. Has tried TheraFlu without improvement. Denies shortness breath, chest pain, known fever, vomiting, diarrhea, or any other complaints.     Past Medical History:  Diagnosis Date  . Abnormal EKG    TWI V1-V4  . Priapism    11/15/2011  . Syncope    11/16/2011 in setting of priapism    Patient Active Problem List   Diagnosis Date Noted  . Abnormal EKG 11/15/2011    History reviewed. No pertinent surgical history.     Home Medications    Prior to Admission medications   Medication Sig Start Date End Date Taking? Authorizing Provider  albuterol (PROVENTIL HFA;VENTOLIN HFA) 108 (90 Base) MCG/ACT inhaler Inhale 2 puffs into the lungs every 4 (four) hours as needed for wheezing or shortness of breath. 12/10/16   Hillery Bhalla C Saylah Ketner, PA-C  azithromycin (ZITHROMAX) 250 MG tablet Take 1 tablet (250 mg total) by mouth daily. Take first 2 tablets together, then 1 every day until finished. 12/10/16   Feven Alderfer C Josiane Labine, PA-C  benzonatate (TESSALON) 100 MG capsule Take 1 capsule (100 mg total) by mouth every 8 (eight) hours. 12/10/16   Rudra Hobbins C Kirstein Baxley, PA-C  naproxen (NAPROSYN) 500 MG tablet Take 1 tablet (500 mg total) by mouth 2 (two) times daily. 12/10/16   Almir Botts C Azazel Franze, PA-C  predniSONE (STERAPRED UNI-PAK 21 TAB) 10 MG (21) TBPK tablet Take 6 tabs day 1, 5 tabs day 2, 4 tabs day 3, 3 tabs day 4, 2 tabs day 5, and 1 tab on day 6. 12/10/16   Anselm PancoastShawn C Derk Doubek, PA-C    Family History Family History  Problem Relation Age of Onset  . Diabetes type  II Mother     Social History Social History  Substance Use Topics  . Smoking status: Never Smoker  . Smokeless tobacco: Never Used  . Alcohol use Yes     Comment: ocassionally     Allergies   Patient has no known allergies.   Review of Systems Review of Systems  Constitutional: Positive for chills. Negative for fever.  Respiratory: Positive for cough. Negative for shortness of breath.   Cardiovascular: Negative for chest pain.  Gastrointestinal: Positive for nausea. Negative for vomiting.  All other systems reviewed and are negative.    Physical Exam Updated Vital Signs BP 131/95 (BP Location: Left Arm)   Pulse 90   Temp 99.8 F (37.7 C) (Oral)   Resp 18   SpO2 98%   Physical Exam  Constitutional: He appears well-developed and well-nourished. No distress.  HENT:  Head: Normocephalic and atraumatic.  Mouth/Throat: Oropharynx is clear and moist.  Eyes: Conjunctivae are normal.  Neck: Neck supple.  Cardiovascular: Normal rate, regular rhythm, normal heart sounds and intact distal pulses.   Pulmonary/Chest: Effort normal and breath sounds normal. No respiratory distress.  Abdominal: Soft. There is no tenderness. There is no guarding.  Musculoskeletal: He exhibits no edema.  Lymphadenopathy:    He has no cervical adenopathy.  Neurological: He  is alert.  Skin: Skin is warm and dry. He is not diaphoretic.  Psychiatric: He has a normal mood and affect. His behavior is normal.  Nursing note and vitals reviewed.    ED Treatments / Results  Labs (all labs ordered are listed, but only abnormal results are displayed) Labs Reviewed - No data to display  EKG  EKG Interpretation None       Radiology Dg Chest 2 View  Result Date: 12/10/2016 CLINICAL DATA:  Productive cough with chills and nausea. Symptoms since this past Tuesday. EXAM: CHEST  2 VIEW COMPARISON:  11/15/2011 FINDINGS: Indistinct primarily retrocardiac airspace opacity on the left, questionable  central lucency, suspicious for pneumonia, correlate with lung auscultation. The lungs appear otherwise clear. Cardiac and mediastinal margins appear normal. IMPRESSION: 1. Vague density projecting over the cardiac shadow on the frontal projection is suspicious for left lower lobe pneumonia. Electronically Signed   By: Gaylyn Rong M.D.   On: 12/10/2016 19:51    Procedures Procedures (including critical care time)  Medications Ordered in ED Medications - No data to display   Initial Impression / Assessment and Plan / ED Course  I have reviewed the triage vital signs and the nursing notes.  Pertinent labs & imaging results that were available during my care of the patient were reviewed by me and considered in my medical decision making (see chart for details).  Clinical Course     Patient presents with rectal cough and chills. Possible evidence of pneumonia on chest x-ray, clinically correlated. Patient is nontoxic appearing and I doubt sepsis at this time. Appears to be appropriate for outpatient therapy. Return precautions discussed.  Vitals:   12/10/16 1909 12/10/16 2053  BP: 131/95 136/76  Pulse: 90 77  Resp: 18 18  Temp: 99.8 F (37.7 C)      Final Clinical Impressions(s) / ED Diagnoses   Final diagnoses:  Community acquired pneumonia of left lower lobe of lung (HCC)    New Prescriptions Discharge Medication List as of 12/10/2016  8:24 PM    START taking these medications   Details  albuterol (PROVENTIL HFA;VENTOLIN HFA) 108 (90 Base) MCG/ACT inhaler Inhale 2 puffs into the lungs every 4 (four) hours as needed for wheezing or shortness of breath., Starting Fri 12/10/2016, Print    azithromycin (ZITHROMAX) 250 MG tablet Take 1 tablet (250 mg total) by mouth daily. Take first 2 tablets together, then 1 every day until finished., Starting Fri 12/10/2016, Print    benzonatate (TESSALON) 100 MG capsule Take 1 capsule (100 mg total) by mouth every 8 (eight) hours.,  Starting Fri 12/10/2016, Print    naproxen (NAPROSYN) 500 MG tablet Take 1 tablet (500 mg total) by mouth 2 (two) times daily., Starting Fri 12/10/2016, Print    predniSONE (STERAPRED UNI-PAK 21 TAB) 10 MG (21) TBPK tablet Take 6 tabs day 1, 5 tabs day 2, 4 tabs day 3, 3 tabs day 4, 2 tabs day 5, and 1 tab on day 6., Print         Anselm Pancoast, PA-C 12/11/16 0109    Derwood Kaplan, MD 12/11/16 0132

## 2016-12-10 NOTE — Discharge Instructions (Signed)
There was evidence of possible pneumonia on the chest x-ray. Please take all of your antibiotics until finished!   You may develop abdominal discomfort or diarrhea from the antibiotic.  You may help offset this with probiotics which you can buy or get in yogurt. Do not eat or take the probiotics until 2 hours after your antibiotic.   Additional treatment is symptomatic care and it is important to note that these symptoms may last for 7-10 days. Drink plenty of fluids and get plenty of rest. You should be drinking at least half a liter of water an hour to stay hydrated. Electrolyte drinks are also encouraged. Ibuprofen, Naproxen, or Tylenol for pain or fever. Tessalon for cough. Plain Mucinex may help relieve congestion. Warm liquids or Chloraseptic spray may help soothe a sore throat. Follow up with a primary care provider, as needed, for any future management of this issue. Return to the ED should symptoms worsen.

## 2016-12-10 NOTE — ED Triage Notes (Addendum)
Pt complaint of flu like symptoms 1/6 with associated chills and cough. Pt verbalizes blood tinged sputum with cough starting 1/9. Pt verbalizes pain with cough only.

## 2020-07-25 ENCOUNTER — Emergency Department (HOSPITAL_COMMUNITY)
Admission: EM | Admit: 2020-07-25 | Discharge: 2020-07-25 | Disposition: A | Discharge status: Home / Self Care | Payer: Self-pay | Attending: Emergency Medicine | Admitting: Emergency Medicine

## 2020-07-25 ENCOUNTER — Other Ambulatory Visit: Payer: Self-pay

## 2020-07-25 ENCOUNTER — Emergency Department (HOSPITAL_COMMUNITY): Payer: Self-pay

## 2020-07-25 ENCOUNTER — Encounter (HOSPITAL_COMMUNITY): Payer: Self-pay

## 2020-07-25 DIAGNOSIS — R200 Anesthesia of skin: Secondary | ICD-10-CM | POA: Insufficient documentation

## 2020-07-25 DIAGNOSIS — R519 Headache, unspecified: Secondary | ICD-10-CM | POA: Insufficient documentation

## 2020-07-25 DIAGNOSIS — S0101XD Laceration without foreign body of scalp, subsequent encounter: Secondary | ICD-10-CM | POA: Insufficient documentation

## 2020-07-25 DIAGNOSIS — Z5321 Procedure and treatment not carried out due to patient leaving prior to being seen by health care provider: Secondary | ICD-10-CM | POA: Insufficient documentation

## 2020-07-25 DIAGNOSIS — W1839XD Other fall on same level, subsequent encounter: Secondary | ICD-10-CM | POA: Insufficient documentation

## 2020-07-25 DIAGNOSIS — M549 Dorsalgia, unspecified: Secondary | ICD-10-CM | POA: Insufficient documentation

## 2020-07-25 LAB — BASIC METABOLIC PANEL
Anion gap: 10 (ref 5–15)
BUN: 9 mg/dL (ref 6–20)
CO2: 28 mmol/L (ref 22–32)
Calcium: 10.2 mg/dL (ref 8.9–10.3)
Chloride: 100 mmol/L (ref 98–111)
Creatinine, Ser: 0.84 mg/dL (ref 0.61–1.24)
GFR calc Af Amer: >60 mL/min (ref >60)
GFR calc non Af Amer: >60 mL/min (ref >60)
Glucose, Bld: 108 mg/dL — ABNORMAL HIGH (ref 70–99)
Potassium: 4.2 mmol/L (ref 3.5–5.1)
Sodium: 138 mmol/L (ref 135–145)

## 2020-07-25 LAB — CBC
HCT: 47.3 % (ref 39.0–52.0)
Hemoglobin: 15.1 g/dL (ref 13.0–17.0)
MCH: 27 pg (ref 26.0–34.0)
MCHC: 31.9 g/dL (ref 30.0–36.0)
MCV: 84.6 fL (ref 80.0–100.0)
Platelets: 251 10*3/uL (ref 150–400)
RBC: 5.59 MIL/uL (ref 4.22–5.81)
RDW: 13.9 % (ref 11.5–15.5)
WBC: 9.7 10*3/uL (ref 4.0–10.5)
nRBC: 0 % (ref 0.0–0.2)

## 2020-07-25 NOTE — ED Notes (Signed)
pts family member stated they were leaving to go get seen at another facility. This NT informed family member that wait times were likely high other places and we would like him to stay here and get seen. Family member verbalized understanding, stated they were leaving.

## 2020-07-25 NOTE — ED Triage Notes (Signed)
Pt presents with entire Right side numbness after a fall Saturday night while at a friends house in [place]. Pt states he blacked out Saturday night, fell hit the back of his head, had to get staples. Pt presents with a soft c-collar in place. Pt states "it feels like a stake is in her back."

## 2020-07-25 NOTE — ED Notes (Signed)
New head "heaviness" and R arm pain discussed with Dr. Adela Lank, new imaging ordered,.

## 2020-08-07 ENCOUNTER — Other Ambulatory Visit: Payer: Self-pay

## 2020-08-07 ENCOUNTER — Encounter: Payer: Self-pay | Admitting: Neurology

## 2020-08-07 ENCOUNTER — Ambulatory Visit: Payer: BC Managed Care – PPO | Admitting: Neurology

## 2020-08-07 VITALS — Ht 68.0 in | Wt 178.3 lb

## 2020-08-07 DIAGNOSIS — R55 Syncope and collapse: Secondary | ICD-10-CM | POA: Diagnosis not present

## 2020-08-07 DIAGNOSIS — S0990XD Unspecified injury of head, subsequent encounter: Secondary | ICD-10-CM

## 2020-08-07 DIAGNOSIS — S066X9D Traumatic subarachnoid hemorrhage with loss of consciousness of unspecified duration, subsequent encounter: Secondary | ICD-10-CM

## 2020-08-07 DIAGNOSIS — S065X1D Traumatic subdural hemorrhage with loss of consciousness of 30 minutes or less, subsequent encounter: Secondary | ICD-10-CM | POA: Diagnosis not present

## 2020-08-07 NOTE — Progress Notes (Signed)
Subjective:    Patient ID: Joe KennedyReginald I Williamson is a 29 y.o. male.  HPI     History:    Dear Dr. Kateri PlummerMorrow,   I saw your patient, Joe Williamson, upon your kind request in my neurologic clinic today for initial consultation of his history of head injury and subdural as well as subarachnoid hemorrhage due to trauma in August 2021. The patient is unaccompanied today. As you know, Joe Williamson is a 29 year old right-handed gentleman with an underlying benign medical history other than remote history of syncopal spell, who [facility] after a fall with loss of consciousness and head injury reported. He was found to have traumatic small subarachnoid and subdural hematomas. I reviewed the hospital records. He did report having had alcohol at the time but hadbeen eating and drinking well otherwise. No obvious witnessed convulsion, he did sustain a scalp laceration and needed staples, which have been removed, he also had his GM take out his braids. He injured his shoulder as well. He did have preceding lightheadedness.   He reports that he was actually standing off and on for about an hour.  He had about 2 drinks of alcohol, he reports that he had plenty of water and was eating and drinking and was with friends.  He did notice some lightheadedness and wanted to get towards a wall to lean against it but he did not make it that far.  He fell backwards, hit his head, he was unconscious, unclear of time, no bowel or bladder incontinence, no tongue bite, could not move and felt stiff particularly in his upper body, could not move his right shoulder area and could not get up on his own, legs started moving first.  No convulsions.  No history of epilepsy or febrile seizures, no family history of epilepsy.  No family history of syncope. He does not use any illicit drugs.  He does not smoke and drinks alcohol occasionally on the weekend.  He drinks no daily caffeine and  usually hydrates well.  He works as a OptometristE teacher.  In the hospital, he had a neurosurgical consultation and repeat CT scan was recommended. Antiplatelet medication including aspirin or anticoagulants were advised to be held for at least 14 days. He was supposed to follow-up in [facility] clinic [facility]. Work-up also included an echocardiogram which did not show any obvious abnormality. Orthostatic vital signs were normal in the hospital. He was discharged on 07/22/2020. Upon discharge she was given a prescription for Flexeril for 5 days, he was advised to take Tylenol as needed every 8 hours, he was given a prescription for gabapentin for 3 days 100 mg 3 times daily. He was also given a 5-day prescription for oxycodone. I reviewed your office note from 07/31/2020. He presented [facility] on 07/25/2020 due to pain, he did not stay. He presented [facility] on 07/25/2020 with pain in the right shoulder and tingling in both arms. He was noted to have decreased range of motion in the right shoulder. He was given a prescription for morphine sulfate IR 15 mg.  he was advised not to drive until cleared by neurology.  XR Spine Thoracic  Result Date: 07/20/2020 IMPRESSION: Negative thoracic spine.  CT Head Without IV Contrast  Result Date: 07/20/2020 IMPRESSION: Head CT: Right frontal contusion injury with small foci of subarachnoid hemorrhage. Trace frontal parafalcine subdural hemorrhage. Cervical CT: No cervical spine fracture  or traumatic malalignment. The above report was telephoned to [facility] on 07/20/2020 4:39 AM.   CT Spine Cervical Without IV Contrast  Result Date: 07/20/2020 IMPRESSION: Head CT: Right frontal contusion injury with small foci of subarachnoid hemorrhage. Trace frontal parafalcine subdural hemorrhage. Cervical CT: No cervical spine fracture or traumatic malalignment.   He currently feels at baseline with  the exception of residual soreness around the right shoulder area.  He has not seen orthopedic yet.  He had some tingling affecting the right arm but this is better.  He has no recurrent headaches, no lightheadedness, no vertiginous symptoms, no nausea, no light sensitivity.  He has not had an eye examination in years, last time he had seen an eye doctor was in high school, he needed glasses and tried contact lenses.  He does not have any double vision or blurry vision or loss of vision.  He denies any sudden onset of one-sided weakness or numbness or tingling otherwise, no droopy face or slurring of speech.  His Past Medical History Is Significant For: Past Medical History:  Diagnosis Date  . Abnormal EKG    TWI V1-V4  . Priapism    11/15/2011  . Syncope    11/16/2011 in setting of priapism    His Past Surgical History Is Significant For: No past surgical history on file.  His Family History Is Significant For: Family History  Problem Relation Age of Onset  . Diabetes type II Mother     His Social History Is Significant For: Social History   Socioeconomic History  . Marital status: Single    Spouse name: Not on file  . Number of children: Not on file  . Years of education: Not on file  . Highest education level: Not on file  Occupational History  . Not on file  Tobacco Use  . Smoking status: Never Smoker  . Smokeless tobacco: Never Used  Substance and Sexual Activity  . Alcohol use: Yes    Comment: ocassionally  . Drug use: No  . Sexual activity: Never  Other Topics Concern  . Not on file  Social History Narrative  . Not on file   Social Determinants of Health   Financial Resource Strain:   . Difficulty of Paying Living Expenses: Not on file  Food Insecurity:   . Worried About Programme researcher, broadcasting/film/video in the Last Year: Not on file  . Ran Out of Food in the Last Year: Not on file  Transportation Needs:   . Lack of Transportation (Medical): Not on file  . Lack of  Transportation (Non-Medical): Not on file  Physical Activity:   . Days of Exercise per Week: Not on file  . Minutes of Exercise per Session: Not on file  Stress:   . Feeling of Stress : Not on file  Social Connections:   . Frequency of Communication with Friends and Family: Not on file  . Frequency of Social Gatherings with Friends and Family: Not on file  . Attends Religious Services: Not on file  . Active Member of Clubs or Organizations: Not on file  . Attends Banker Meetings: Not on file  . Marital Status: Not on file    His Allergies Are:  No Known Allergies:   His Current Medications Are:  Outpatient Encounter Medications as of 08/07/2020  Medication Sig  . morphine (MSIR) 15 MG tablet SMARTSIG:1 By Mouth 4-5 Times Daily  . UNABLE TO FIND Med Name: Pt reports he  is taking a muscle relaxer, unsure of the name/dosage of med  . [DISCONTINUED] albuterol (PROVENTIL HFA;VENTOLIN HFA) 108 (90 Base) MCG/ACT inhaler Inhale 2 puffs into the lungs every 4 (four) hours as needed for wheezing or shortness of breath.  . [DISCONTINUED] azithromycin (ZITHROMAX) 250 MG tablet Take 1 tablet (250 mg total) by mouth daily. Take first 2 tablets together, then 1 every day until finished.  . [DISCONTINUED] benzonatate (TESSALON) 100 MG capsule Take 1 capsule (100 mg total) by mouth every 8 (eight) hours.  . [DISCONTINUED] naproxen (NAPROSYN) 500 MG tablet Take 1 tablet (500 mg total) by mouth 2 (two) times daily.  . [DISCONTINUED] predniSONE (STERAPRED UNI-PAK 21 TAB) 10 MG (21) TBPK tablet Take 6 tabs day 1, 5 tabs day 2, 4 tabs day 3, 3 tabs day 4, 2 tabs day 5, and 1 tab on day 6.   No facility-administered encounter medications on file as of 08/07/2020.  :  Review of Systems:  Out of a complete 14 point review of systems, all are reviewed and negative with the exception of these symptoms as listed below:      Review of Systems  Neurological:       Pt reports about 2 weeks ago he  was out of town with his friends and passed out and hit his head on the floor. Pt reports the back of his head to the brunt of the fall and had to have 4 staples. Pt reports he felt dizzy and lightheaded before he passed out. He reports one other incident similar to this 10+ years ago. Denies feeling dizzy or lightheaded today.     Objective:  Neurological Exam  Physical Exam Physical Examination:   There were no vitals filed for this visit.  General Examination: The patient is a very pleasant 29 y.o. male in no acute distress. He appears well-developed and well-nourished and well groomed.   Orthostatic vitals unremarkable, no orthostatic lightheadedness or vertiginous symptoms.  No light sensitivity.  HEENT: Normocephalic, pupils are equal, round and reactive to light and accommodation. Funduscopic exam is normal with sharp disc margins noted. Extraocular tracking is good without limitation to gaze excursion or nystagmus noted. Normal smooth pursuit is noted. Hearing is grossly intact. Face is symmetric with normal facial animation and normal facial sensation. Speech is clear with no dysarthria noted. There is no hypophonia. There is no lip, neck/head, jaw or voice tremor. Neck is supple with full range of passive and active motion. There are no carotid bruits on auscultation. Oropharynx exam reveals: mild mouth dryness, good dental hygiene. Tongue protrudes centrally and palate elevates symmetrically.   Chest: Clear to auscultation without wheezing, rhonchi or crackles noted.  Heart: S1+S2+0, regular and normal without murmurs, rubs or gallops noted.   Abdomen: Soft, non-tender and non-distended with normal bowel sounds appreciated on auscultation.  Extremities: There is no pitting edema in the distal lower extremities bilaterally. Pedal pulses are intact.  Skin: Warm and dry without trophic changes noted.  Musculoskeletal: exam reveals soreness in the muscular area of the right  shoulder posteriorly, no crepitation, fairly good range of motion in both shoulders.   Neurologically:  Mental status: The patient is awake, alert and oriented in all 4 spheres. His immediate and remote memory, attention, language skills and fund of knowledge are appropriate. There is no evidence of aphasia, agnosia, apraxia or anomia. Speech is clear with normal prosody and enunciation. Thought process is linear. Mood is normal and affect is normal.  Cranial nerves  II - XII are as described above under HEENT exam. In addition: shoulder shrug is normal with equal shoulder height noted. Motor exam: Normal bulk, strength and tone is noted. There is no drift, tremor or rebound. Romberg is negative. Reflexes are 2 to 3+ throughout. Babinski: Toes are flexor bilaterally. Fine motor skills and coordination: intact with normal finger taps, normal hand movements, normal rapid alternating patting, normal foot taps and normal foot agility.  Cerebellar testing: No dysmetria or intention tremor on finger to nose testing. Heel to shin is unremarkable bilaterally. There is no truncal or gait ataxia.  Sensory exam: intact to light touch in the upper and lower extremities.  Gait, station and balance: He stands easily. No veering to one side is noted. No leaning to one side is noted. Posture is age-appropriate and stance is narrow based. Gait shows normal stride length and normal pace. No problems turning are noted. Tandem walk is unremarkable. Intact toe and heel stance is noted.               Assessment and Plan:   In summary, Joe Williamson is a very pleasant 29 y.o.-year old male with an underlying benign medical history other than remote history of syncopal spell, who presents for evaluation of his recent passing out spell which resulted in head injury and traumatic subdural and subarachnoid hematomas, loss of consciousness.  A clear cause for his passing out spell/syncope has not been determined.  He had  work-up in the hospital in August.  He has been instructed not to drive until cleared by neurology he reports.  I recommend that he not drive until 6 months after his event so long as he stays event free.  A seizure cannot be fully excluded.  He may have had a vasovagal syncope or orthostatic event.  He does report that he could not move when he woke up, he felt that his upper body was stiff.  He feels much improved, at baseline with the exception of ongoing soreness in the right shoulder but good range of motion is noted today.  He is in the process of seeing orthopedics as I understand.  A referral has been made by your office but he has not heard anything yet.  I would recommend evaluation through cardiology.  An underlying cardiac cause including bradycardia or arrhythmia cannot be excluded.  He did have an echocardiogram during the hospitalization in August.  He was supposed to follow-up in concussion clinic through [facility].  I have made a referral in that regard, I think it is important that he gets plugged in with the concussion clinic although he is feeling better.  He does not have any recurrent headaches.  He is advised to stay well rested and well-hydrated.  We talked about triggers that can lead to syncope as well as seizures.  I would like to proceed with an EEG through our office.  We will call him to keep him posted as to the test results once they are available.   We will consider a repeat MRI at the time of his next visit, I suggested a follow-up in 3 months, sooner if needed.   Of note, he denies taking any illicit drugs.  I did not see a UDS from the hospitalization.  ER comments included as per EMS that he had EtOH and marijuana.   The patient is not pleased with my recommendation of not driving for 6 months.  Unfortunately, a seizure event cannot be fully excluded  based on the description of his event.  Differential diagnosis includes vasovagal syncope, bradycardia, arrhythmia, orthostatic  hypotension medication or drug or toxic effect from a substance and seizure. He is advised to talk to you about a referral to cardiology and about the status of his referral to orthopedics.  I placed a referral to concussion clinic at [facility].  I answered all his questions today and he was in agreement with the plan. Thank you very much for allowing me to participate in the care of this nice patient. If I can be of any further assistance to you please do not hesitate to call me at [phone].  Sincerely,   Huston Foley, MD, PhD

## 2020-08-07 NOTE — Patient Instructions (Signed)
It was nice to meet you today.  I am glad to hear that you are feeling better. Nevertheless, I do not have a good explanation as to your fall and passing out spell, difficult to rule out seizure especially since you had trouble moving your body after you came to.  Please remember triggers for syncope and also for seizures can be sleep deprivation, dehydration, overheating, stress, hyper glycemia meaning low blood sugar values or blood sugar fluctuations, excess alcohol, suddenly stopping alcohol after excess consumption, and certain medications.  Given your traumatic head injury and your passing out spell without obvious cause, I recommend that you do not drive or operate any heavy or dangerous machinery for 6 months, so long as you are completely clear of symptoms.  We do not have proof that you had a seizure but we do not have proof that you did not have a seizure either.  Therefore, my recommendation is that you do not drive for 6 months, this also means that you should not swim alone or take a tub bath. Do not climb on ladders or be at heights alone. Do not cook with large quantities of boiling water or oil for safety. Please ensure the water temperature at home is not too high. When carrying or caring for small children and infants, make sure you sit down when holding the child are feeding the child or changing them to minimize risk for injury to the child are to you if you were to have a seizure.   Please talk to your primary care physician about being evaluated for an underlying cardiac cause, you may benefit from getting a heart monitor.   Please also talk to Dr. Kateri Plummer about a referral to orthopedics.  [facility] as they had recommended this upon discharge.  From my end of things you do not need any new medications.  We will do an EEG (brainwave test), which we will schedule. We will call you with the results.

## 2020-08-20 ENCOUNTER — Other Ambulatory Visit: Payer: Self-pay

## 2020-08-20 ENCOUNTER — Ambulatory Visit: Payer: BC Managed Care – PPO | Admitting: Neurology

## 2020-08-20 DIAGNOSIS — S0990XD Unspecified injury of head, subsequent encounter: Secondary | ICD-10-CM

## 2020-08-20 DIAGNOSIS — S066X9D Traumatic subarachnoid hemorrhage with loss of consciousness of unspecified duration, subsequent encounter: Secondary | ICD-10-CM

## 2020-08-20 DIAGNOSIS — S065X1D Traumatic subdural hemorrhage with loss of consciousness of 30 minutes or less, subsequent encounter: Secondary | ICD-10-CM

## 2020-08-20 DIAGNOSIS — R55 Syncope and collapse: Secondary | ICD-10-CM

## 2020-08-30 NOTE — Procedures (Signed)
   HISTORY: 29 year old male with history of traumatic brain injury, intracranial bleeding,  TECHNIQUE:  This is a routine 16 channel EEG recording with one channel devoted to a limited EKG recording.  It was performed during wakefulness, drowsiness and asleep.  Hyperventilation and photic stimulation were performed as activating procedures.  There are minimum muscle and movement artifact noted.  Upon maximum arousal, posterior dominant waking rhythm consistent of rhythmic alpha range activity, with frequency of 10 hz. Activities are symmetric over the bilateral posterior derivations and attenuated with eye opening.  Hyperventilation produced mild/moderate buildup with higher amplitude and the slower activities noted.  Photic stimulation did not alter the tracing.  During EEG recording, patient developed drowsiness and entered sleep, sleep EEG demonstrated architecture, there were frontal centrally dominant vertex waves and symmetric sleep spindles noted.  During EEG recording, there was no epileptiform discharge noted.  EKG demonstrate sinus rhythm, with heart rate of 64 bpm CONCLUSION: This is a  normal awake and sleep EEG.  There is no electrodiagnostic evidence of epileptiform discharge.  Levert Feinstein, M.D. Ph.D.  [facility] [address] [place], [place] [address] Phone: [phone] Fax:      [phone]

## 2020-09-01 ENCOUNTER — Telehealth: Payer: Self-pay | Admitting: *Deleted

## 2020-09-01 NOTE — Telephone Encounter (Signed)
-----   Message from Huston Foley, MD sent at 09/01/2020  7:02 AM EDT ----- Please call and advise the patient that the EEG or brain wave test we performed was reported as normal in the awake and sleep states. We checked for abnormal electrical discharges in the brain waves and the report suggested normal findings. No further action is required on this test at this time. Please remind patient to keep any upcoming appointments or tests and to call us with any interim questions, concerns, problems or updates. Thanks,  Huston Foley, MD, PhD

## 2020-09-01 NOTE — Progress Notes (Signed)
Please call and advise the patient that the EEG or brain wave test we performed was reported as normal in the awake and sleep states. We checked for abnormal electrical discharges in the brain waves and the report suggested normal findings. No further action is required on this test at this time. Please remind patient to keep any upcoming appointments or tests and to call us with any interim questions, concerns, problems or updates. Thanks,  Marlaya Turck, MD, PhD   

## 2020-09-01 NOTE — Telephone Encounter (Signed)
I spoke to the patient and provided him with the EEG results. He verbalized understanding and appreciated the call.

## 2020-09-07 NOTE — Progress Notes (Signed)
Cardiology Office Note:    Date:  09/08/2020   ID:  Joe Williamson, DOB [birth year], MRN [id number]  PCP:  Joe Has, MD  [facility] Cardiologist:  No primary care provider on file.  [facility] Electrophysiologist:  None   Referring MD: Joe Has, MD     History of Present Illness:    Joe Williamson is a 29 y.o. male with a hx of who was referred by Dr. Kateri Williamson for evaluation of syncope.  The patient presented to [facility] on 07/20/20 after a fall with LOC. During the episode, he hit his head and was found to have a small traumatic subarachnoid and subdural hematomas as well as a scalp laceration requiring stables. He was drinking alcohol during the event but no witnessed convulsion. He was otherwise eating and drinking well during the day. He notes some lightheadedness prior to the event. He wanted to get to the wall to support himself, but unfortunately syncopized prior to being able to do so. Unknown how long he was down, but returned back to baseline mental status. In the hospital, patient was monitored with serial CTs. Orthostatics negative. TTE without acute pathology. EEG without seizure activity. He was discharged on 07/22/20.  He Williamson had follow-up with Neurology who recommended that he not drive for at least 2months given unknown etiology of his event. Also plan on possible repeat imaging of the head in 3 months as well as EEG.   Patient states that on 07/22/20, he went to visit friends in [place] had 2 alcoholic beverages, several bottles of water, and was eating food regularly throughout the day. Felt lightheaded after standing for a couple of hours and went to try to brace himself on the wall but he fainted before he made it. Was taken to [facility] in [place]. Lost feeling in his arms and felt like his "shoulders locked up." Work-up as detailed above was normal with unclear etiology of syncope. He denies any palpitations, SOB, orthopnea, LE  edema. No chest pain. He is very active as a Optometrist and is able to exercise without limitations.   He had a prior syncopal episode approximately 10 years ago when he stood up too fast. Had stress test at that time which was reportedly normal.  No significant family history of arrhythmia, SCD, CAD.   ECG normal sinus rhythm. No block. Normal Qtc.  Labs: Na 138, K 4.2, Cr 0.84, HgB 15.1  Past Medical History:  Diagnosis Date  . Abnormal EKG    TWI V1-V4  . Priapism    11/15/2011  . Syncope    11/16/2011 in setting of priapism    No past surgical history on file.  Current Medications: No outpatient medications have been marked as taking for the 09/08/20 encounter (Office Visit) with Meriam Sprague, MD.     Allergies:   Patient Williamson no known allergies.   Social History   Socioeconomic History  . Marital status: Single    Spouse name: Not on file  . Number of children: Not on file  . Years of education: Not on file  . Highest education level: Not on file  Occupational History  . Not on file  Tobacco Use  . Smoking status: Never Smoker  . Smokeless tobacco: Never Used  Substance and Sexual Activity  . Alcohol use: Yes    Comment: ocassionally  . Drug use: No  . Sexual activity: Never  Other Topics Concern  . Not on file  Social History Narrative  . Not on file   Social Determinants of Health   Financial Resource Strain:   . Difficulty of Paying Living Expenses: Not on file  Food Insecurity:   . Worried About Programme researcher, broadcasting/film/videounning Out of Food in the Last Year: Not on file  . Ran Out of Food in the Last Year: Not on file  Transportation Needs:   . Lack of Transportation (Medical): Not on file  . Lack of Transportation (Non-Medical): Not on file  Physical Activity:   . Days of Exercise per Week: Not on file  . Minutes of Exercise per Session: Not on file  Stress:   . Feeling of Stress : Not on file  Social Connections:   . Frequency of Communication with Friends  and Family: Not on file  . Frequency of Social Gatherings with Friends and Family: Not on file  . Attends Religious Services: Not on file  . Active Member of Clubs or Organizations: Not on file  . Attends BankerClub or Organization Meetings: Not on file  . Marital Status: Not on file     Family History: The patient's family history includes Diabetes type II in his mother.  ROS:   Please see the history of present illness.    The patient denies chest pain, chest pressure, dyspnea at rest or with exertion, palpitations, PND, orthopnea, or leg swelling. No palpitations. Denies cough, fever, chills. Denies nausea, vomiting. Denies snoring.  EKGs/Labs/Other Studies Reviewed:    The following studies were reviewed today: TTE 07/20/20: Summary:  1. Thereis normal left ventricular chamber size, systolic function, and  systolic function. EF 55-60%.  2. Normal left ventricular diastolic filling is observed.  3. No significant valvular abnormalities.  4. No pulmonary hypertension is noted.  5. The inferior vena cava is normal in size with >50% inspiratory  collapse, suggestive of a normal right atrial pressure (3mmHg).   CT head 07/20/20: IMPRESSION:  Head CT:  Right frontal contusion injury with small foci of subarachnoid hemorrhage. Trace frontal parafalcine subdural hemorrhage.  Cervical CT:  No cervical spine fracture or traumatic malalignment.   ECG 08/07/20: NSR with incomplete RBBB   EKG:  EKG is 08/07/20: NSR with non-specific ST-T wave changes  Recent Labs: 07/25/2020: BUN 9; Creatinine, Ser 0.84; Hemoglobin 15.1; Platelets 251; Potassium 4.2; Sodium 138  Recent Lipid Panel No results found for: CHOL, TRIG, HDL, CHOLHDL, VLDL, LDLCALC, LDLDIRECT    Physical Exam:    VS:  BP 120/68   Pulse (!) 59   Ht 5\' 8"  (1.727 m)   Wt 183 lb (83 kg)   SpO2 99%   BMI 27.83 kg/m     Wt Readings from Last 3 Encounters:  09/08/20 183 lb (83 kg)  08/07/20 178 lb 5 oz (80.9 kg)    07/25/20 180 lb (81.6 kg)     GEN:  Well nourished, well developed in no acute distress HEENT: Normal NECK: No JVD; No carotid bruits LYMPHATICS: No lymphadenopathy CARDIAC: Bradycardic, regular, no murmurs, rubs, gallops RESPIRATORY:  Clear to auscultation without rales, wheezing or rhonchi  ABDOMEN: Soft, non-tender, non-distended MUSCULOSKELETAL:  No edema; No deformity  SKIN: Warm and dry NEUROLOGIC:  Alert and oriented x 3 PSYCHIATRIC:  Normal affect   ASSESSMENT:    1. Syncope and collapse    PLAN:    In order of problems listed above:  #Syncope with headstrike: Patient with syncopal episode with headstrike found to have small SAH and SDH on imaging. Unclear etiology. EEG in  hospital negative. Orthostatic negative. TTE unremarkable with normal EF and no evidence of valvular disease. Patient was drinking alcohol on day of syncope (2 drinks) but denies any other substance use. He was otherwise eating and drinking well. Williamson remote history of syncope in the past when he fainted after standing up rapidly. Work-up at that time including stress test was reportedly normal. Seen by Neurology who recommended cardiac evaluation in addition to seizure work-up. -Zio patch to assess for arrhythmias -[facility] with normal BiV function, no significant valvular abnormalities -EEG without evidence of seizure activity -Not on any medications; no illicit drug use -No driving for at least 6 months per Neuro team while still undergoing work-up of syncope -Discussed staying hydrated, increased salt intake, compression stockings, and counter-maneuvers if he feels like he is going to faint -Continue follow-up with Neurology team -Will follow-up in 3 months  Medication Adjustments/Labs and Tests Ordered: Current medicines are reviewed at length with the patient today.  Concerns regarding medicines are outlined above.  Orders Placed This Encounter  Procedures  . LONG TERM MONITOR (3-14 DAYS)    No orders of the defined types were placed in this encounter.   Patient Instructions  Medication Instructions:  Your physician recommends that you continue on your current medications as directed. Please refer to the Current Medication list given to you today.  *If you need a refill on your cardiac medications before your next appointment, please call your pharmacy*   Lab Work: None  If you have labs (blood work) drawn today and your tests are completely normal, you will receive your results only by: Marland Kitchen MyChart Message (if you have MyChart) OR . A paper copy in the mail If you have any lab test that is abnormal or we need to change your treatment, we will call you to review the results.   Testing/Procedures: Your physician Williamson recommended that you wear a 14 day monitor. These monitors are medical devices that record the heart's electrical activity. Doctors most often use these monitors to diagnose arrhythmias. Arrhythmias are problems with the speed or rhythm of the heartbeat. The monitor is a small, portable device. You can wear one while you do your normal daily activities. This is usually used to diagnose what is causing palpitations/syncope (passing out).  Follow-Up: Follow up with Dr. Shari Prows on 12/09/2020 at 9:20 AM   Other Instructions ZIO XT- Long Term Monitor Instructions   Your physician Williamson requested you wear your ZIO patch monitor 14 days.   This is a single patch monitor.  [employer] supplies one patch monitor per enrollment.  Additional stickers are not available.   Please do not apply patch if you will be having a Nuclear Stress Test, Echocardiogram, Cardiac CT, MRI, or Chest Xray during the time frame you would be wearing the monitor. The patch cannot be worn during these tests.  You cannot remove and re-apply the ZIO XT patch monitor.   Your ZIO patch monitor will be sent USPS Priority mail from [facility] directly to your home address. The monitor may  also be mailed to a PO BOX if home delivery is not available.   It may take 3-5 days to receive your monitor after you have been enrolled.   Once you have received you monitor, please review enclosed instructions.  Your monitor Williamson already been registered assigning a specific monitor serial # to you.   Applying the monitor   Shave hair from upper left chest.   Hold abrader disc  by orange tab.  Rub abrader in 40 strokes over left upper chest as indicated in your monitor instructions.   Clean area with 4 enclosed alcohol pads .  Use all pads to assure are is cleaned thoroughly.  Let dry.   Apply patch as indicated in monitor instructions.  Patch will be place under collarbone on left side of chest with arrow pointing upward.   Rub patch adhesive wings for 2 minutes.Remove white label marked "1".  Remove white label marked "2".  Rub patch adhesive wings for 2 additional minutes.   While looking in a mirror, press and release button in center of patch.  A small green light will flash 3-4 times .  This will be your only indicator the monitor Williamson been turned on.     Do not shower for the first 24 hours.  You may shower after the first 24 hours.   Press button if you feel a symptom. You will hear a small click.  Record Date, Time and Symptom in the Patient Log Book.   When you are ready to remove patch, follow instructions on last 2 pages of Patient Log Book.  Stick patch monitor onto last page of Patient Log Book.   Place Patient Log Book in [place] box.  Use locking tab on box and tape box closed securely.  The [employer] and [employer] Williamson [employer] on it.  Please place in mailbox as soon as possible.  Your physician should have your test results approximately 7 days after the monitor Williamson been mailed back to [facility].   Call [facility] Customer Care at [phone] if you have questions regarding your ZIO XT patch monitor.  Call them immediately if you see an orange light blinking on  your monitor.   If your monitor falls off in less than 4 days contact our Monitor department at [phone].  If your monitor becomes loose or falls off after 4 days call [employer] at [phone] for suggestions on securing your monitor.       Signed, Meriam Sprague, MD  09/08/2020 4:09 PM    [facility]

## 2020-09-08 ENCOUNTER — Other Ambulatory Visit: Payer: Self-pay

## 2020-09-08 ENCOUNTER — Encounter: Payer: Self-pay | Admitting: Cardiology

## 2020-09-08 ENCOUNTER — Encounter: Payer: Self-pay | Admitting: *Deleted

## 2020-09-08 ENCOUNTER — Ambulatory Visit: Payer: BC Managed Care – PPO | Admitting: Cardiology

## 2020-09-08 VITALS — BP 120/68 | HR 59 | Ht 68.0 in | Wt 183.0 lb

## 2020-09-08 DIAGNOSIS — R55 Syncope and collapse: Secondary | ICD-10-CM

## 2020-09-08 NOTE — Progress Notes (Signed)
Patient ID: Joe Williamson, male   DOB: [birth year], 29 y.o.   MRN: [id number] Patient enrolled for [employer] to ship a 14 day ZIO XT long term holter monitor to his home.

## 2020-09-08 NOTE — Patient Instructions (Addendum)
Medication Instructions:  Your physician recommends that you continue on your current medications as directed. Please refer to the Current Medication list given to you today.  *If you need a refill on your cardiac medications before your next appointment, please call your pharmacy*   Lab Work: None  If you have labs (blood work) drawn today and your tests are completely normal, you will receive your results only by:  MyChart Message (if you have MyChart) OR  A paper copy in the mail If you have any lab test that is abnormal or we need to change your treatment, we will call you to review the results.   Testing/Procedures: Your physician has recommended that you wear a 14 day monitor. These monitors are medical devices that record the hearts electrical activity. Doctors most often use these monitors to diagnose arrhythmias. Arrhythmias are problems with the speed or rhythm of the heartbeat. The monitor is a small, portable device. You can wear one while you do your normal daily activities. This is usually used to diagnose what is causing palpitations/syncope (passing out).  Follow-Up: Follow up with Dr. Shari Prows on 12/09/2020 at 9:20 AM   Other Instructions ZIO XT- Long Term Monitor Instructions   Your physician has requested you wear your ZIO patch monitor 14 days.   This is a single patch monitor.  [employer] supplies one patch monitor per enrollment.  Additional stickers are not available.   Please do not apply patch if you will be having a Nuclear Stress Test, Echocardiogram, Cardiac CT, MRI, or Chest Xray during the time frame you would be wearing the monitor. The patch cannot be worn during these tests.  You cannot remove and re-apply the ZIO XT patch monitor.   Your ZIO patch monitor will be sent USPS Priority mail from [facility] directly to your home address. The monitor may also be mailed to a PO BOX if home delivery is not available.   It may take 3-5 days to receive  your monitor after you have been enrolled.   Once you have received you monitor, please review enclosed instructions.  Your monitor has already been registered assigning a specific monitor serial # to you.   Applying the monitor   Shave hair from upper left chest.   Hold abrader disc by orange tab.  Rub abrader in 40 strokes over left upper chest as indicated in your monitor instructions.   Clean area with 4 enclosed alcohol pads .  Use all pads to assure are is cleaned thoroughly.  Let dry.   Apply patch as indicated in monitor instructions.  Patch will be place under collarbone on left side of chest with arrow pointing upward.   Rub patch adhesive wings for 2 minutes.Remove white label marked "1".  Remove white label marked "2".  Rub patch adhesive wings for 2 additional minutes.   While looking in a mirror, press and release button in center of patch.  A small green light will flash 3-4 times .  This will be your only indicator the monitor has been turned on.     Do not shower for the first 24 hours.  You may shower after the first 24 hours.   Press button if you feel a symptom. You will hear a small click.  Record Date, Time and Symptom in the Patient Log Book.   When you are ready to remove patch, follow instructions on last 2 pages of Patient Log Book.  Stick patch monitor onto last page of  Patient Log Book.   Place Patient Log Book in [place] box.  Use locking tab on box and tape box closed securely.  The [employer] and [employer] has [employer] on it.  Please place in mailbox as soon as possible.  Your physician should have your test results approximately 7 days after the monitor has been mailed back to [facility].   Call [facility] Customer Care at [phone] if you have questions regarding your ZIO XT patch monitor.  Call them immediately if you see an orange light blinking on your monitor.   If your monitor falls off in less than 4 days contact our Monitor department at  [phone].  If your monitor becomes loose or falls off after 4 days call [employer] at [phone] for suggestions on securing your monitor.

## 2020-09-10 ENCOUNTER — Ambulatory Visit (INDEPENDENT_AMBULATORY_CARE_PROVIDER_SITE_OTHER): Payer: BC Managed Care – PPO

## 2020-09-10 DIAGNOSIS — R55 Syncope and collapse: Secondary | ICD-10-CM

## 2020-10-15 ENCOUNTER — Telehealth: Payer: Self-pay | Admitting: Cardiology

## 2020-10-15 NOTE — Telephone Encounter (Signed)
Patient returning a call from our office to discuss monitor results. Please call back

## 2020-10-15 NOTE — Telephone Encounter (Signed)
I spoke with patient and reviewed monitor results with him.  He has not had any more episodes of passing out and is feeling good.

## 2020-11-06 ENCOUNTER — Ambulatory Visit: Payer: BC Managed Care – PPO | Admitting: Neurology

## 2020-11-10 ENCOUNTER — Encounter: Payer: Self-pay | Admitting: Neurology

## 2020-11-10 ENCOUNTER — Ambulatory Visit (INDEPENDENT_AMBULATORY_CARE_PROVIDER_SITE_OTHER): Payer: BC Managed Care – PPO | Admitting: Neurology

## 2020-11-10 VITALS — BP 112/66 | HR 57 | Ht 67.0 in | Wt 185.0 lb

## 2020-11-10 DIAGNOSIS — S066X9D Traumatic subarachnoid hemorrhage with loss of consciousness of unspecified duration, subsequent encounter: Secondary | ICD-10-CM | POA: Diagnosis not present

## 2020-11-10 DIAGNOSIS — R55 Syncope and collapse: Secondary | ICD-10-CM

## 2020-11-10 DIAGNOSIS — S065X1D Traumatic subdural hemorrhage with loss of consciousness of 30 minutes or less, subsequent encounter: Secondary | ICD-10-CM | POA: Diagnosis not present

## 2020-11-10 NOTE — Progress Notes (Signed)
Subjective:    Patient ID: Joe Williamson is a 29 y.o. male.  HPI     Interim history:   Joe Williamson is a 29 year old right-handed gentleman with an underlying benign medical history other than remote history of syncopal spell, who presents for follow-up consultation of his history of traumatic subdural hematoma and subarachnoid hematoma sustained during a fall in August 2021. The patient is unaccompanied today. I first met him at the request of his primary care physician on 08/07/2020, at which time he reported a syncopal event in August. He had hospitalization and work-up for this. Neurological exam at the time of his visit was normal. I did suggest an EEG and he was reminded not to drive for 6 months and I made a referral to concussion clinic as recommended in his discharge instructions from the hospital.  He had an EEG on 08/20/2020 and I reviewed the results: Conclusion: This is a normal awake and sleep EEG. There is no electrodiagnostic evidence of epileptiform discharge.  He saw a PM & R specialist through [facility] on 09/02/2020 and I reviewed the office note. No further evaluation was recommended. Consideration of neuropsychological evaluation was recommended if he had cognitive complaints.  He saw cardiology on 09/08/2020 and a heart monitor was recommended. Exam is otherwise benign.  Today, 11/10/2020: He reports feeling well, no new intermittent episodes of loss of consciousness or new symptoms, denies any headaches, denies any spells of lightheadedness or twitching, tries to hydrate well and rest well.  He saw orthopedics and had resolution of his right shoulder pain and has been released by orthopedics.  The patient's allergies, current medications, family history, past medical history, past social history, past surgical history and problem list were reviewed and updated as appropriate.   Previously:   08/07/20: (He) presented to [facility] on 07/20/2020  after a fall with loss of consciousness and head injury reported. He was found to have traumatic small subarachnoid and subdural hematomas. I reviewed the hospital records. He did report having had alcohol at the time but hadbeen eating and drinking well otherwise. No obvious witnessed convulsion, he did sustain a scalp laceration and needed staples, which have been removed, he also had his GM take out his braids. He injured his shoulder as well. He did have preceding lightheadedness.    He reports that he was actually standing off and on for about an hour.  He had about 2 drinks of alcohol, he reports that he had plenty of water and was eating and drinking and was with friends.  He did notice some lightheadedness and wanted to get towards a wall to lean against it but he did not make it that far.  He fell backwards, hit his head, he was unconscious, unclear of time, no bowel or bladder incontinence, no tongue bite, could not move and felt stiff particularly in his upper body, could not move his right shoulder area and could not get up on his own, legs started moving first.  No convulsions.  No history of epilepsy or febrile seizures, no family history of epilepsy.  No family history of syncope. He does not use any illicit drugs.  He does not smoke and drinks alcohol occasionally on the weekend.  He drinks no daily caffeine and usually hydrates well.  He works as a Careers information officer.   In the hospital, he had a neurosurgical consultation and repeat CT scan was recommended. Antiplatelet medication including aspirin or anticoagulants were advised to  be held for at least 14 days. He was supposed to follow-up in concussion clinic at [facility]. Work-up also included an echocardiogram which did not show any obvious abnormality. Orthostatic vital signs were normal in the hospital. He was discharged on 07/22/2020. Upon discharge she was given a prescription for Flexeril for 5 days, he was advised to take Tylenol as needed every  8 hours, he was given a prescription for gabapentin for 3 days 100 mg 3 times daily. He was also given a 5-day prescription for oxycodone. I reviewed your office note from 07/31/2020. He presented to the emergency room at [facility] on 07/25/2020 due to pain, he did not stay. He presented to [facility] ER on 07/25/2020 with pain in the right shoulder and tingling in both arms. He was noted to have decreased range of motion in the right shoulder. He was given a prescription for morphine sulfate IR 15 mg. he was advised not to drive until cleared by neurology.   XR Spine Thoracic  Result Date: 07/20/2020 IMPRESSION: Negative thoracic spine.  CT Head Without IV Contrast  Result Date: 07/20/2020 IMPRESSION: Head CT: Right frontal contusion injury with small foci of subarachnoid hemorrhage. Trace frontal parafalcine subdural hemorrhage. Cervical CT: No cervical spine fracture or traumatic malalignment. The above report was telephoned to [facility]. on 07/20/2020 4:39 AM.   CT Spine Cervical Without IV Contrast  Result Date: 07/20/2020 IMPRESSION: Head CT: Right frontal contusion injury with small foci of subarachnoid hemorrhage. Trace frontal parafalcine subdural hemorrhage. Cervical CT: No cervical spine fracture or traumatic malalignment.    He currently feels at baseline with the exception of residual soreness around the right shoulder area.  He has not seen orthopedic yet.  He had some tingling affecting the right arm but this is better.  He has no recurrent headaches, no lightheadedness, no vertiginous symptoms, no nausea, no light sensitivity.  He has not had an eye examination in years, last time he had seen an eye doctor was in high school, he needed glasses and tried contact lenses.  He does not have any double vision or blurry vision or loss of vision.  He denies any sudden onset of one-sided weakness or numbness or tingling otherwise, no droopy face or  slurring of speech.  His Past Medical History Is Significant For: Past Medical History:  Diagnosis Date  . Abnormal EKG    TWI V1-V4  . Priapism    11/15/2011  . Syncope    11/16/2011 in setting of priapism    His Past Surgical History Is Significant For: No past surgical history on file.  His Family History Is Significant For: Family History  Problem Relation Age of Onset  . Diabetes type II Mother     His Social History Is Significant For: Social History   Socioeconomic History  . Marital status: Single    Spouse name: Not on file  . Number of children: Not on file  . Years of education: Not on file  . Highest education level: Not on file  Occupational History  . Not on file  Tobacco Use  . Smoking status: Never Smoker  . Smokeless tobacco: Never Used  Substance and Sexual Activity  . Alcohol use: Yes    Comment: ocassionally  . Drug use: No  . Sexual activity: Never  Other Topics Concern  . Not on file  Social History Narrative  . Not on file   Social Determinants of Health  Financial Resource Strain: Not on file  Food Insecurity: Not on file  Transportation Needs: Not on file  Physical Activity: Not on file  Stress: Not on file  Social Connections: Not on file    His Allergies Are:  No Known Allergies:   His Current Medications Are:  No outpatient encounter medications on file as of 11/10/2020.   No facility-administered encounter medications on file as of 11/10/2020.  :  Review of Systems:  Out of a complete 14 point review of systems, all are reviewed and negative with the exception of these symptoms as listed below: Review of Systems  Neurological:       Pt presents today to for follow up. Pt denies any new symptoms and has been doing well.    Objective:  Neurological Exam  Physical Exam Physical Examination:   Vitals:   11/10/20 1026  BP: 112/66  Pulse: (!) 57    General Examination: The patient is a very pleasant 29 y.o.  male in no acute distress. He appears well-developed and well-nourished and well groomed.    HEENT: Normocephalic, pupils are equal, round and reactive to light. Extraocular tracking is good without limitation to gaze excursion or nystagmus noted. Normal smooth pursuit is noted. Hearing is grossly intact. Face is symmetric with normal facial animation and normal facial sensation. Speech is clear with no dysarthria noted. There is no hypophonia. There is no lip, neck/head, jaw or voice tremor. Neck with FROM. There are no carotid bruits on auscultation. Oropharynx exam reveals: mild mouth dryness, good dental hygiene. Tongue protrudes centrally and palate elevates symmetrically.   Chest: Clear to auscultation without wheezing, rhonchi or crackles noted.  Heart: S1+S2+0, regular and normal without murmurs, rubs or gallops noted.   Abdomen: Soft, non-tender and non-distended with normal bowel sounds appreciated on auscultation.  Extremities: There is no pitting edema in the distal lower extremities bilaterally.   Skin: Warm and dry without trophic changes noted.  Musculoskeletal: exam reveals  good range of motion in both shoulders, no soreness reported.   Neurologically:  Mental status: The patient is awake, alert and oriented in all 4 spheres. His immediate and remote memory, attention, language skills and fund of knowledge are appropriate. There is no evidence of aphasia, agnosia, apraxia or anomia. Speech is clear with normal prosody and enunciation. Thought process is linear. Mood is normal and affect is normal.  Cranial nerves II - XII are as described above under HEENT exam. In addition: shoulder shrug is normal with equal shoulder height noted. Motor exam: Normal bulk, strength and tone is noted. There is no drift, tremor or rebound. Romberg is negative. Reflexes are 2+ throughout. Babinski: Toes are flexor bilaterally. Fine motor skills and coordination: intact grossly in the upper  and lower extremities.   Cerebellar testing: No dysmetria or intention tremor on finger to nose testing. Heel to shin is unremarkable bilaterally. There is no truncal or gait ataxia.  Sensory exam: intact to light touch in the upper and lower extremities.  Gait, station and balance: He stands easily. No veering to one side is noted. No leaning to one side is noted. Posture is age-appropriate and stance is narrow based. Gait shows normal stride length and normal pace. No problems turning are noted. Tandem walk is unremarkable.   Assessment and Plan:   In summary, Joe Williamson is a very pleasant 29 year old male with an underlying benign medical history other than remote history of syncopal spell, who presents for follow-up consultation  of his passing out spell in August 2021 for which he was hospitalized through the [facility] and had work-up at the time including CT scans of the head and neck.  He sustained a head injury and traumatic subdural and subarachnoid small hemorrhages, had loss of consciousness.  A clear cause for his passing out spell/syncope was not exactly determined.  Work-up in the hospital in August was done which I reviewed and we also did an EEG which did not show any abnormalities.  He has been instructed by me to not drive until 6 months of symptom freedom since a seizure was not fully excluded.  Differential diagnosis included vasovagal syncope, orthostatic blood pressure drop.  He reported upper body stiffness after he had fallen and that he could not move when he woke up, he felt that his upper body was stiff.  He feels improved and has been symptom-free.  He has seen orthopedics and also a rehab specialist in the interim.  His neurological exam is normal thankfully.  He is advised that he can resume driving 6 months after his event which should be in the third week of February next year.  I would like to proceed with a brain MRI without contrast to make sure his  hemorrhages have resolved or significantly improved on scans as well.  He is agreeable to this approach and we can call him to schedule his test as well as with the results and follow-up in this clinic only if needed.  He is advised to return to his primary care physician at this point and we will keep him posted as to his MRI results by phone call.  I answered all his questions today and he was in agreement with the plan.  He was given written instructions as well today. I spent 30 minutes in total face-to-face time and in reviewing records during pre-charting, more than 50% of which was spent in counseling and coordination of care, reviewing test results, reviewing medications and treatment regimen and/or in discussing or reviewing the diagnosis of syncope and collapse, the prognosis and treatment options. Pertinent laboratory and imaging test results that were available during this visit with the patient were reviewed by me and considered in my medical decision making (see chart for details).

## 2020-11-10 NOTE — Patient Instructions (Signed)
It was good to see you again today.  I am glad to hear that you are feeling better.  So long as you do not have any other incidents of loss of consciousness or falls or involuntary twitching, you can resume driving 6 months after your loss of consciousness event which was on 07/20/2020.    As a reassurance, to make sure your traumatic subdural hemorrhage and subarachnoid hemorrhage which is bleeding in and around the brain have resolved, I would like to proceed with a brain MRI without contrast.  We will call you to schedule and also with the results, so long as the results are benign/reassuring, we can see you in this clinic on an as-needed basis.  Please continue to stay well-hydrated, well rested, pursue healthy lifestyle in general.

## 2020-11-11 ENCOUNTER — Telehealth: Payer: Self-pay | Admitting: Neurology

## 2020-11-11 NOTE — Telephone Encounter (Signed)
no to the covid questions MR Brain wo contrast Dr. Alinda Sierras Berkley Harvey: [phone] (exp. 11/11/20 to 05/09/21). Patient is scheduled at [facility] For 11/18/20.

## 2020-11-18 ENCOUNTER — Ambulatory Visit (INDEPENDENT_AMBULATORY_CARE_PROVIDER_SITE_OTHER): Payer: BC Managed Care – PPO

## 2020-11-18 DIAGNOSIS — S066X9D Traumatic subarachnoid hemorrhage with loss of consciousness of unspecified duration, subsequent encounter: Secondary | ICD-10-CM

## 2020-11-18 DIAGNOSIS — S065X1D Traumatic subdural hemorrhage with loss of consciousness of 30 minutes or less, subsequent encounter: Secondary | ICD-10-CM

## 2020-11-18 DIAGNOSIS — R55 Syncope and collapse: Secondary | ICD-10-CM | POA: Diagnosis not present

## 2020-11-19 ENCOUNTER — Telehealth: Payer: Self-pay | Admitting: *Deleted

## 2020-11-19 NOTE — Progress Notes (Signed)
Please call patient and advise him that his recent brain MRI showed signs of prior bleeding from his old injury, no signs of new bleeding or acute abnormalities.  Overall reassuring results.  As discussed, he can follow-up with [place] on an as-needed basis.

## 2020-11-19 NOTE — Telephone Encounter (Signed)
Called pt & LVM (Ok per [facility]) advising that Dr Frances Furbish reviewed his MRI brain results. MRI showed signs of prior bleeding from his old injury, no signs of new bleeding or acute abnormalities. Overall reassuring results. As discussed with Dr Frances Furbish, he can follow-up with [place] on an as-needed basis. Left office number in message.    Huston Foley, MD  11/19/2020 8:22 AM EST      Please call patient and advise him that his recent brain MRI showed signs of prior bleeding from his old injury, no signs of new bleeding or acute abnormalities. Overall reassuring results. As discussed, he can follow-up with [place] on an as-needed basis.

## 2020-11-28 NOTE — Progress Notes (Deleted)
Cardiology Office Note:    Date:  11/28/2020   ID:  Joe Williamson, DOB [birth year], MRN [id number]  PCP:  Farris Has, MD  [facility] Cardiologist:  No primary care provider on file.  [facility] Electrophysiologist:  None   Referring MD: Farris Has, MD    History of Present Illness:    Joe Williamson is a 29 y.o. male with history of syncope who presents to clinic for follow-up.  The patient presented to [facility] on 07/20/20 after a fall with LOC. During the episode, he hit his head and was found to have a small traumatic subarachnoid and subdural hematomas as well as a scalp laceration requiring staples. He was drinking alcohol during the event but no witnessed convulsion. He was otherwise eating and drinking well during the day. He noted some lightheadedness prior to the event. He wanted to get to the wall to support himself, but unfortunately syncopized prior to being able to do so. Unknown how long he was down, but returned back to baseline mental status. Denied any palpitations, SOB, orthopnea, LE edema. No chest pain. 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 Orthostatics negative. TTE without acute pathology. EEG without seizure activity. He was discharged on 07/22/20.  He has had follow-up with Neurology who recommended that he not drive for at least 40months given unknown etiology of his event. Also planned on possible repeat imaging of the head in 3 months as well as EEG.   Prior history notable for syncopal episode approximately 10 years ago when he stood up too fast. Had stress test at that time which was reportedly normal. No significant family history of arrhythmia, SCD, CAD. ECG normal sinus rhythm. No block. Normal Qtc.  Long term monitor 10/13/20 with no arrhythmias. Rare PVCs, PACs.  Saw Neuro on 11/10/20 where he was doing well. No further episodes of  LOC, HA, lightheadeddness. Repeat MRI with no subdural hematoma or SAH. Planned to resume driving in Feb after 5months of symptom free.  He now presents to clinic for follow-up. Today, the p  Past Medical History:  Diagnosis Date  . Abnormal EKG    TWI V1-V4  . Priapism    11/15/2011  . Syncope    11/16/2011 in setting of priapism    No past surgical history on file.  Current Medications: No outpatient medications have been marked as taking for the 12/09/20 encounter (Appointment) with Meriam Sprague, MD.     Allergies:   Patient has no known allergies.   Social History   Socioeconomic History  . Marital status: Single    Spouse name: Not on file  . Number of children: Not on file  . Years of education: Not on file  . Highest education level: Not on file  Occupational History  . Not on file  Tobacco Use  . Smoking status: Never Smoker  . Smokeless tobacco: Never Used  Substance and Sexual Activity  . Alcohol use: Yes    Comment: ocassionally  . Drug use: No  . Sexual activity: Never  Other Topics Concern  . Not on file  Social History Narrative  . Not on file   Social Determinants of Health   Financial Resource Strain: Not on file  Food Insecurity: Not on file  Transportation Needs: Not on file  Physical Activity: Not on file  Stress: Not on file  Social Connections: Not  on file     Family History: The patient's ***family history includes Diabetes type II in his mother.  ROS:   Please see the history of present illness.    *** All other systems reviewed and are negative.  EKGs/Labs/Other Studies Reviewed:    The following studies were reviewed today: Cardiac monitor 10/13/20:  Predominant rhythm is sinus with average HR 63 bpm  Rare PACs; rare PVCs  No Afib, VT, or significant pauses   TTE 07/20/20: Summary:  1. Thereis normal left ventricular chamber size, systolic function, and  systolic function. EF 55-60%.  2. Normal left  ventricular diastolic filling is observed.  3. No significant valvular abnormalities.  4. No pulmonary hypertension is noted.  5. The inferior vena cava is normal in size with >50% inspiratory  collapse, suggestive of a normal right atrial pressure ( ).   CT head 07/20/20: IMPRESSION:  Head CT:  Right frontal contusion injury with small foci of subarachnoid hemorrhage. Trace frontal parafalcine subdural hemorrhage.  Cervical CT:  No cervical spine fracture or traumatic malalignment.   ECG 08/07/20: NSR with incomplete RBBB  EKG:  EKG is *** ordered today.  The ekg ordered today demonstrates ***  Recent Labs: 07/25/2020: BUN 9; Creatinine, Ser 0.84; Hemoglobin 15.1; Platelets 251; Potassium 4.2; Sodium 138  Recent Lipid Panel No results found for: CHOL, TRIG, HDL, CHOLHDL, VLDL, LDLCALC, LDLDIRECT   Risk Assessment/Calculations:   {Does this patient have ATRIAL FIBRILLATION?[phone]}   Physical Exam:    VS:  There were no vitals taken for this visit.    Wt Readings from Last 3 Encounters:  11/10/20 185 lb (83.9 kg)  09/08/20 183 lb (83 kg)  08/07/20 178 lb 5 oz (80.9 kg)     GEN: *** Well nourished, well developed in no acute distress HEENT: Normal NECK: No JVD; No carotid bruits LYMPHATICS: No lymphadenopathy CARDIAC: ***RRR, no murmurs, rubs, gallops RESPIRATORY:  Clear to auscultation without rales, wheezing or rhonchi  ABDOMEN: Soft, non-tender, non-distended MUSCULOSKELETAL:  No edema; No deformity  SKIN: Warm and dry NEUROLOGIC:  Alert and oriented x 3 PSYCHIATRIC:  Normal affect   ASSESSMENT:    No diagnosis found. PLAN:    In order of problems listed above:  #Syncope with headstrike: Patient with syncopal episode with headstrike found to have small SAH and SDH on imaging. Unclear etiology. EEG in hospital negative. Orthostatic negative. TTE unremarkable with normal EF and no evidence of valvular disease. Patient was drinking alcohol on day of  syncope (2 drinks) but denies any other substance use. He was otherwise eating and drinking well. Has remote history of syncope in the past when he fainted after standing up rapidly. Work-up at that time including stress test was reportedly normal. Seen by Neurology who recommended cardiac evaluation in addition to seizure work-up. -Zio patch to assess for arrhythmias -TTE at [facility] with normal BiV function, no significant valvular abnormalities -EEG without evidence of seizure activity -Not on any medications; no illicit drug use -No driving for at least 6 months per Neuro team while still undergoing work-up of syncope -Discussed staying hydrated, increased salt intake, compression stockings, and counter-maneuvers if he feels like he is going to faint -Continue follow-up with Neurology team -Will follow-up in 3 months   {Are you ordering a CV Procedure (e.g. stress test, cath, DCCV, TEE, etc)?   Press F2        :[id number]}    Medication Adjustments/Labs and Tests Ordered: Current medicines are reviewed at length with the  patient today.  Concerns regarding medicines are outlined above.  No orders of the defined types were placed in this encounter.  No orders of the defined types were placed in this encounter.   There are no Patient Instructions on file for this visit.   Signed, Meriam Sprague, MD  11/28/2020 4:15 PM     [facility]

## 2020-12-04 ENCOUNTER — Encounter: Payer: Self-pay | Admitting: *Deleted

## 2020-12-09 ENCOUNTER — Ambulatory Visit: Payer: Self-pay | Admitting: Cardiology

## 2022-05-15 IMAGING — DX DG CHEST 2V
2 series · 2 of 2 positions shown · non-contrast
Comparison: 12/10/2016.

CLINICAL DATA: Chest pain.  Fall.

EXAM:
CHEST - 2 VIEW

[w chest lat]
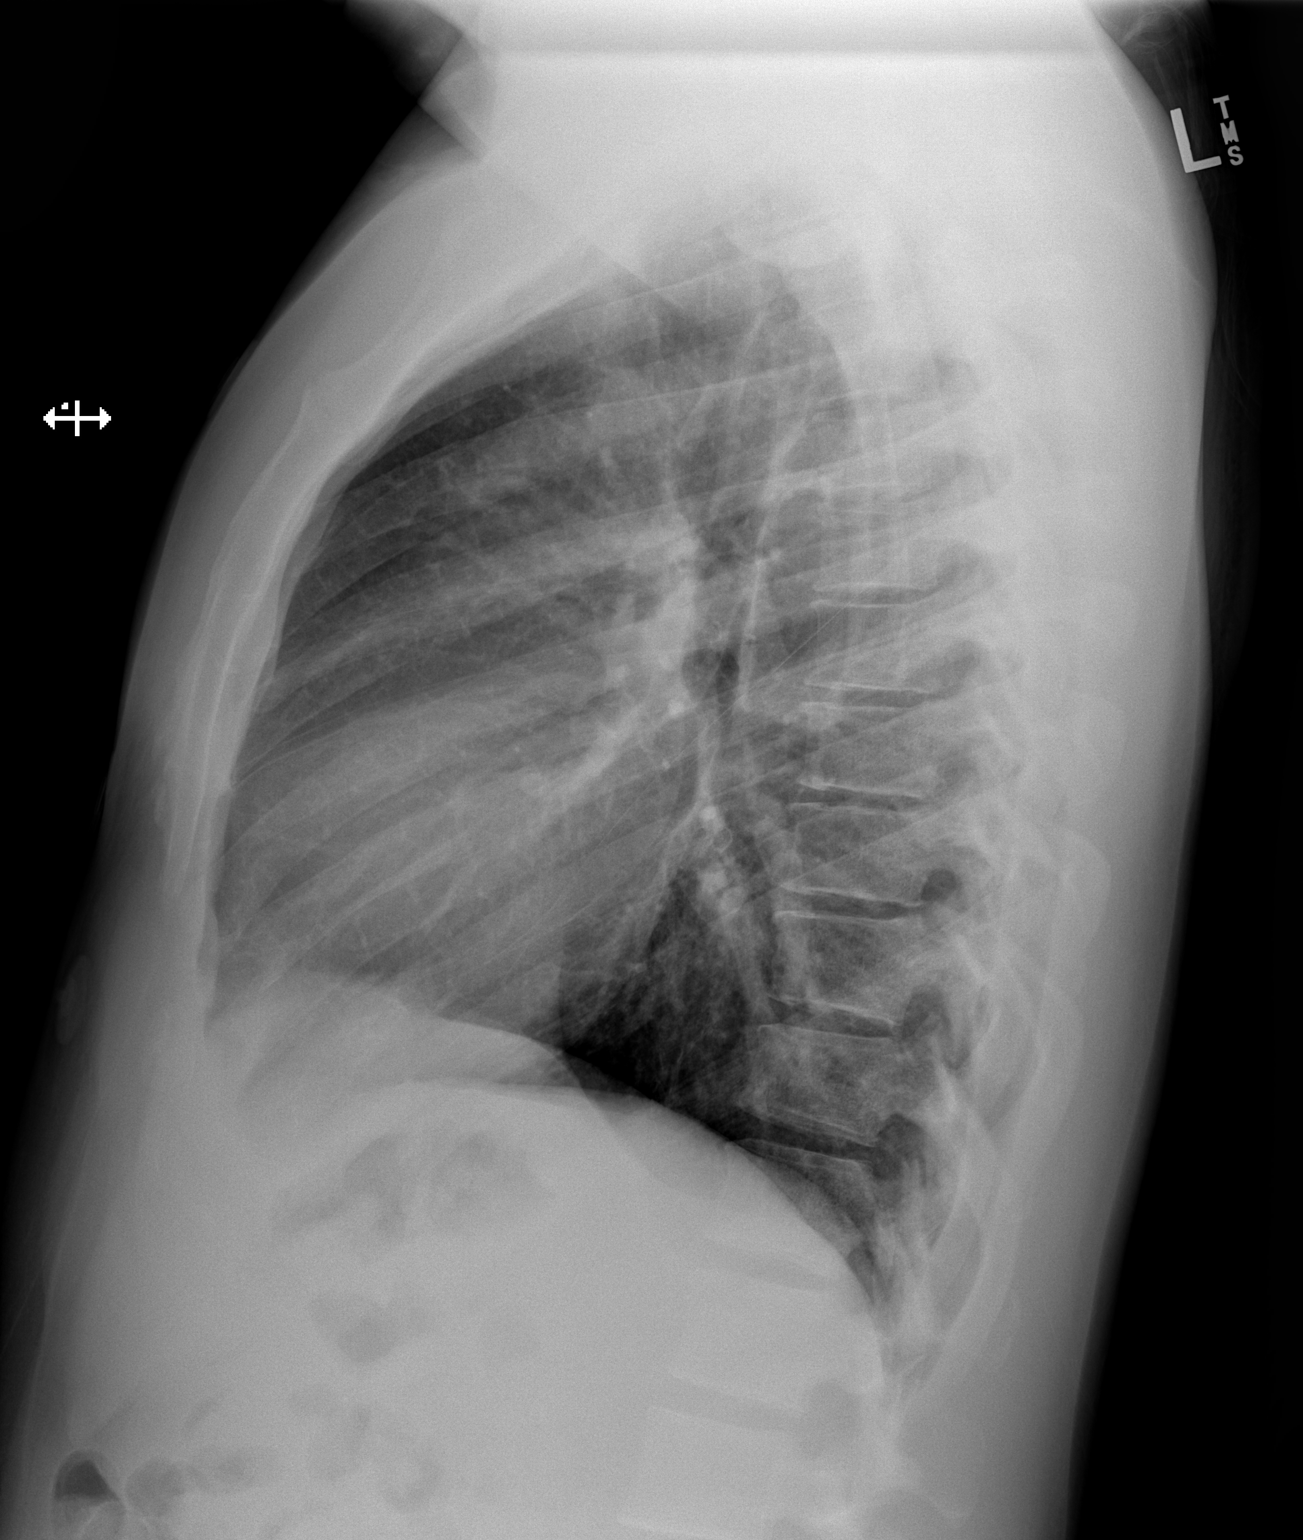

[w chest pa]
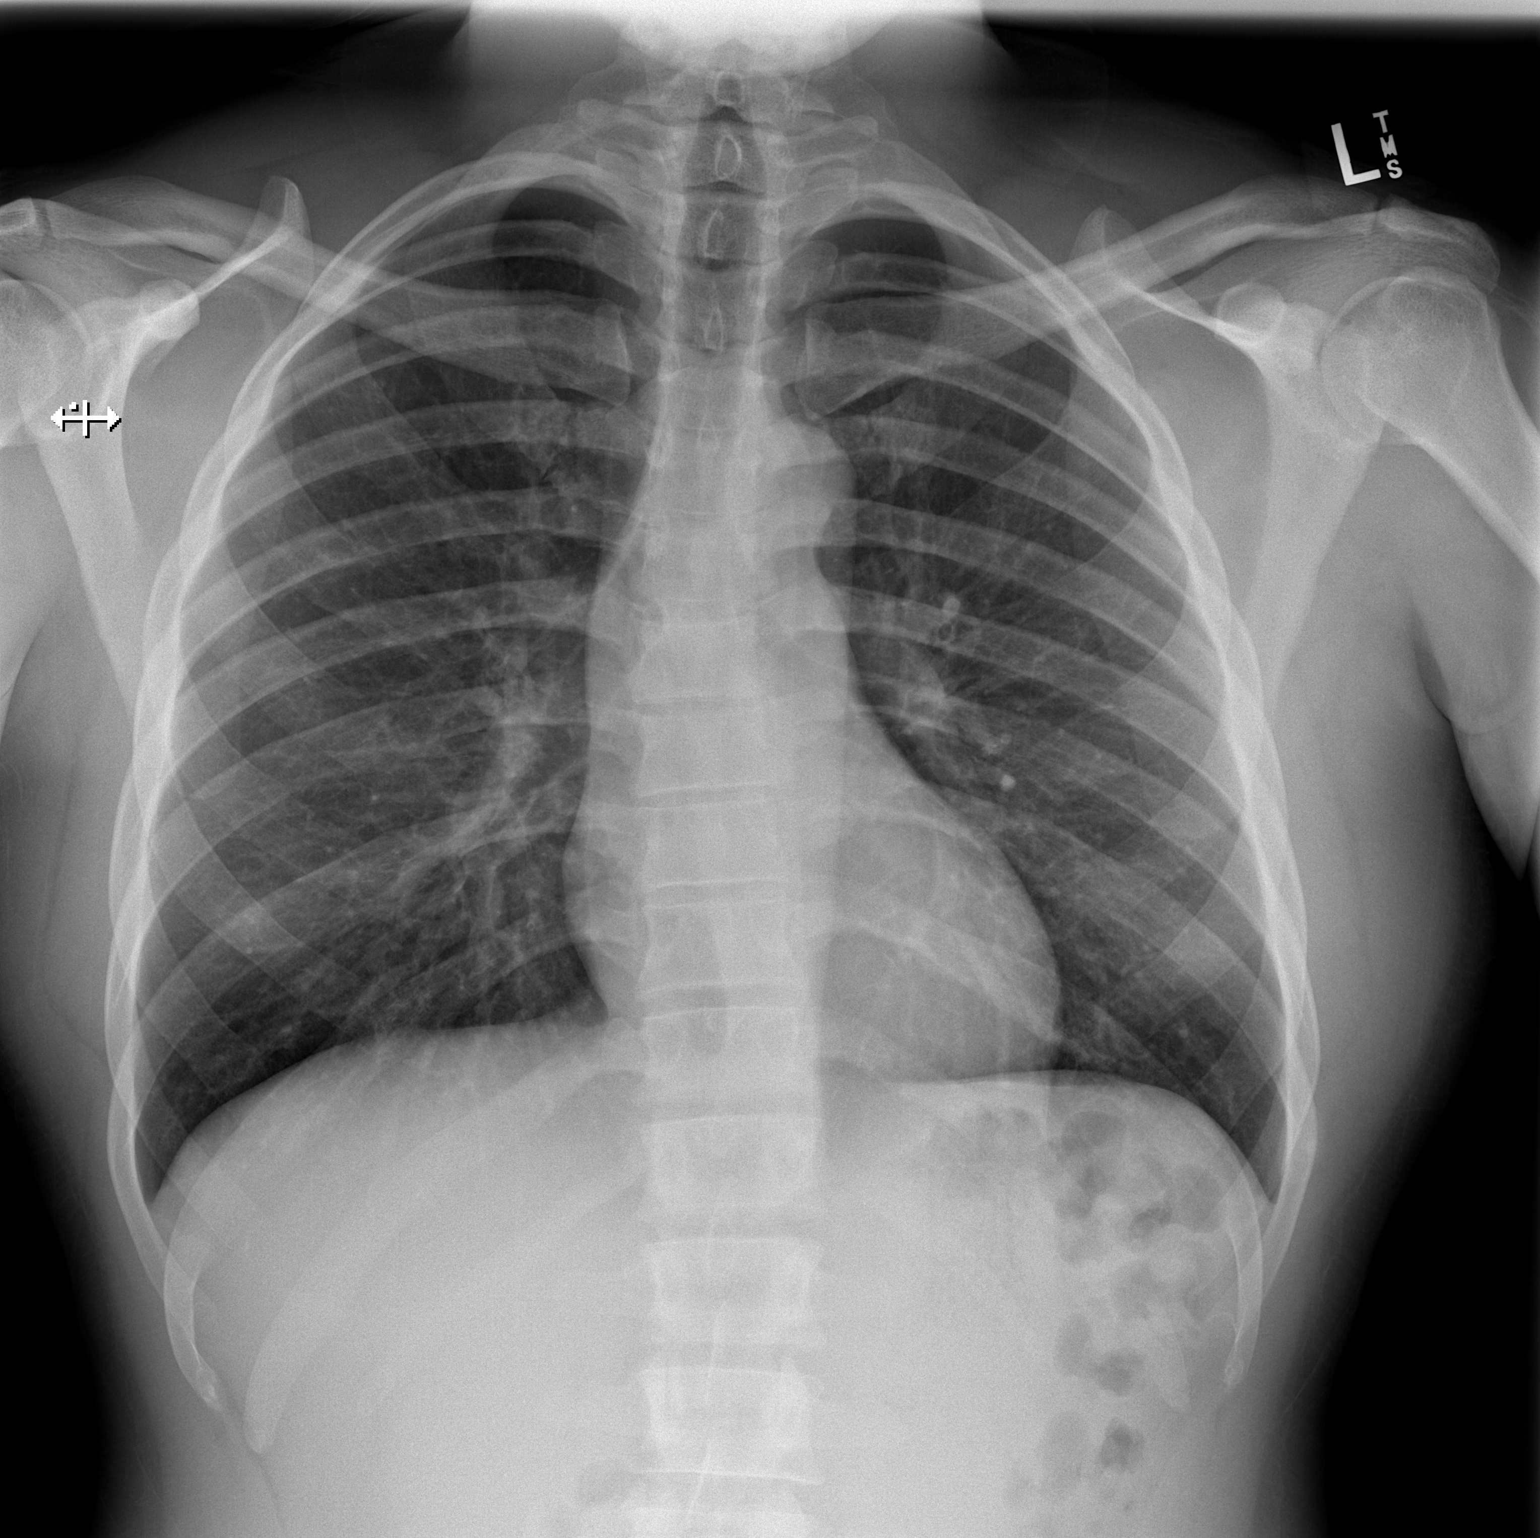

[2 of 2 positions shown; findings below may reference images not displayed]

FINDINGS: Mediastinum and hilar structures normal. Heart size normal. No focal
infiltrate. No pleural effusion or pneumothorax. Mild thoracic spine
scoliosis concave left. No acute bony abnormality.
IMPRESSION: 1.  No acute cardiopulmonary disease.

2.  No acute bony abnormality.  No evidence of pneumothorax.

## 2023-01-20 ENCOUNTER — Other Ambulatory Visit: Payer: Self-pay

## 2023-01-20 ENCOUNTER — Emergency Department (HOSPITAL_BASED_OUTPATIENT_CLINIC_OR_DEPARTMENT_OTHER)
Admission: EM | Admit: 2023-01-20 | Discharge: 2023-01-20 | Disposition: A | Discharge status: Home / Self Care | Payer: BC Managed Care – PPO | Attending: Emergency Medicine | Admitting: Emergency Medicine

## 2023-01-20 ENCOUNTER — Encounter (HOSPITAL_BASED_OUTPATIENT_CLINIC_OR_DEPARTMENT_OTHER): Payer: Self-pay

## 2023-01-20 ENCOUNTER — Emergency Department (HOSPITAL_BASED_OUTPATIENT_CLINIC_OR_DEPARTMENT_OTHER): Payer: BC Managed Care – PPO | Admitting: Radiology

## 2023-01-20 DIAGNOSIS — W19XXXA Unspecified fall, initial encounter: Secondary | ICD-10-CM | POA: Diagnosis not present

## 2023-01-20 DIAGNOSIS — M25532 Pain in left wrist: Secondary | ICD-10-CM | POA: Diagnosis not present

## 2023-01-20 NOTE — ED Provider Notes (Signed)
[facility] Provider Note   CSN: [id number] Arrival date & time: 01/20/23  1232     History  Chief Complaint  Patient presents with   Wrist Pain    Joe Williamson is a 33 y.o. male.  Patient presents to the emergency department complaining of left-sided wrist pain.  Patient states he was playing basketball 2 days ago when he fell, landing on his left wrist with the wrist in extension behind him.  He complains of pain in the left wrist.  He has normal range of motion of the left hand.  He states he tried using a wrist brace at home but it was too painful.  The patient has no relevant past medical history on file  HPI     Home Medications Prior to Admission medications   Not on File      Allergies    Patient has no known allergies.    Review of Systems   Review of Systems  Musculoskeletal:  Positive for arthralgias.    Physical Exam Updated Vital Signs BP (!) 138/93 (BP Location: Right Arm)   Pulse (!) 54   Temp (!) 97.3 F (36.3 C) (Oral)   Resp 16   Ht 5' 7"$  (1.702 m)   Wt 81.6 kg   SpO2 98%   BMI 28.19 kg/m  Physical Exam HENT:     Head: Normocephalic and atraumatic.  Eyes:     Pupils: Pupils are equal, round, and reactive to light.  Pulmonary:     Effort: Pulmonary effort is normal. No respiratory distress.  Musculoskeletal:        General: Tenderness and signs of injury present.     Cervical back: Normal range of motion.     Comments: No snuffbox tenderness, left hand/wrist TTP distal forearm  Skin:    General: Skin is dry.  Neurological:     Mental Status: He is alert.  Psychiatric:        Speech: Speech normal.        Behavior: Behavior normal.     ED Results / Procedures / Treatments   Labs (all labs ordered are listed, but only abnormal results are displayed) Labs Reviewed - No data to display  EKG None  Radiology DG Wrist Complete Left  Result Date: 01/20/2023 CLINICAL DATA:  Left  wrist pain status post fall playing basketball 2 days ago. EXAM: LEFT WRIST - COMPLETE 3+ VIEW COMPARISON:  None Available. FINDINGS: There are multiple visualized ossicles. This includes an approximate 10 x 5 mm ossicle in between the distal radial styloid in the scaphoid waist that appears well corticated and chronic. There is a 4 mm ossicle at the far lateral/radial tip of the lunate which appears well corticated, chronic, and nonunited to the adjacent lunate. This may reflect a prior remote avulsion injury at the scapholunate ligament insertion. There is a 4 mm oval apparent ossicle overlying the dorsal aspect of the midcarpal joint on lateral view, age indeterminate but possibly reflecting a remote injury. Recommend clinical correlation for point tenderness. Mild triscaphe and thumb metacarpal peripheral degenerative spurring without significant joint space narrowing. IMPRESSION: 1. There are multiple visualized ossicles. An ossicle between the distal radius and scaphoid appears chronic and is likely the sequela of remote trauma. 2. An ossicle at the far lateral aspect of the lunate appears chronic and may reflect a prior remote avulsion injury at the scapholunate ligament insertion. 3. A 4 mm ossicle overlying the dorsal aspect  of the midcarpal joint on lateral view is age indeterminate but may be the sequela of remote trauma. Recommend clinical correlation for point tenderness. Electronically Signed   By: Yvonne Kendall M.D.   On: 01/20/2023 13:23    Procedures .Ortho Injury Treatment  Date/Time: 01/20/2023 1:37 PM  Performed by: Dorothyann Peng, PA-C Authorized by: Dorothyann Peng, PA-C   Consent:    Consent obtained:  Verbal   Consent given by:  Patient   Risks discussed:  Restricted joint movement, stiffness, nerve damage, recurrent dislocation and vascular damage   Alternatives discussed:  No treatmentInjury location: wrist Location details: left wrist Injury type: soft  tissue Pre-procedure neurovascular assessment: neurovascularly intact Immobilization: brace Splint Applied by: ED Tech Post-procedure neurovascular assessment: post-procedure neurovascularly intact       Medications Ordered in ED Medications - No data to display  ED Course/ Medical Decision Making/ A&P                             Medical Decision Making Amount and/or Complexity of Data Reviewed Radiology: ordered.   Patient presents with a chief complaint of left-sided wrist pain.  Differential diagnosis includes but is not limited to fracture, dislocation, sprain, and others  The patient has no comorbidities that would affect today's complaint.  There is no recent relevant medical history on file for review  I ordered and interpreted imaging including plain films of the left wrist.  The left wrist has multiple visualized ossicles including ossicles between the distal radius and scaphoid, ossicle at the far lateral aspect of the lunate, and a 4 mm ossicle overlying the dorsal aspect of the midcarpal joint.  Based on radiology report these all may be due to remote trauma.  I agree with radiologist findings  At this point I plan to have the patient placed in a wrist brace with follow-up with orthopedics as needed. The patient does endorse previous remote falls with injury of the same wrist. Patient instructed on supportive care including OTC medication, icing, elevation, and wrist brace.         Final Clinical Impression(s) / ED Diagnoses Final diagnoses:  Left wrist pain  Fall, initial encounter    Rx / DC Orders ED Discharge Orders     None         Ronny Bacon 01/20/23 1340    Maylon Peppers [place], [place] 01/20/23 1510

## 2023-01-20 NOTE — ED Triage Notes (Signed)
Pt to er, pt states that he fell playing basketball on Tuesday, states that he has L wrist pain. States that it hurts to move his wrist.

## 2023-01-20 NOTE — Discharge Instructions (Signed)
You were evaluated today for left-sided wrist pain secondary to a fall.  Your x-ray showed multiple spots which could be indicative of previous small fractures.  You are placed in a brace today for support.  If you continue to have pain in this area I recommend following up with hand surgery.  In the meantime please continue to use the brace, ice the area, elevate the area, and consider using ibuprofen or acetaminophen as needed for pain and inflammation control.
